# Patient Record
Sex: Male | Born: 1945 | Race: White | Hispanic: No | Marital: Married | Smoking: Former smoker
Health system: Southern US, Community
[De-identification: ages and names within clinical notes are randomized; demographics above are authoritative.]

## PROBLEM LIST (undated history)

## (undated) DIAGNOSIS — I10 Essential (primary) hypertension: Secondary | ICD-10-CM

## (undated) DIAGNOSIS — G2581 Restless legs syndrome: Secondary | ICD-10-CM

## (undated) DIAGNOSIS — K8020 Calculus of gallbladder without cholecystitis without obstruction: Secondary | ICD-10-CM

## (undated) DIAGNOSIS — I495 Sick sinus syndrome: Secondary | ICD-10-CM

## (undated) DIAGNOSIS — G4733 Obstructive sleep apnea (adult) (pediatric): Secondary | ICD-10-CM

## (undated) DIAGNOSIS — M1990 Unspecified osteoarthritis, unspecified site: Secondary | ICD-10-CM

## (undated) DIAGNOSIS — K449 Diaphragmatic hernia without obstruction or gangrene: Secondary | ICD-10-CM

## (undated) DIAGNOSIS — Q2730 Arteriovenous malformation, site unspecified: Secondary | ICD-10-CM

## (undated) DIAGNOSIS — I4891 Unspecified atrial fibrillation: Secondary | ICD-10-CM

## (undated) DIAGNOSIS — D128 Benign neoplasm of rectum: Secondary | ICD-10-CM

## (undated) DIAGNOSIS — E785 Hyperlipidemia, unspecified: Secondary | ICD-10-CM

## (undated) DIAGNOSIS — N200 Calculus of kidney: Secondary | ICD-10-CM

## (undated) DIAGNOSIS — G4730 Sleep apnea, unspecified: Secondary | ICD-10-CM

## (undated) DIAGNOSIS — I639 Cerebral infarction, unspecified: Secondary | ICD-10-CM

## (undated) DIAGNOSIS — K219 Gastro-esophageal reflux disease without esophagitis: Secondary | ICD-10-CM

## (undated) DIAGNOSIS — K2270 Barrett's esophagus without dysplasia: Secondary | ICD-10-CM

## (undated) HISTORY — DX: Arteriovenous malformation, site unspecified: Q27.30

## (undated) HISTORY — DX: Sleep apnea, unspecified: G47.30

## (undated) HISTORY — DX: Unspecified osteoarthritis, unspecified site: M19.90

## (undated) HISTORY — PX: CHOLECYSTECTOMY OPEN: SUR202

## (undated) HISTORY — DX: Gastro-esophageal reflux disease without esophagitis: K21.9

## (undated) HISTORY — DX: Obstructive sleep apnea (adult) (pediatric): G47.33

## (undated) HISTORY — DX: Hyperlipidemia, unspecified: E78.5

## (undated) HISTORY — DX: Unspecified atrial fibrillation: I48.91

## (undated) HISTORY — DX: Diaphragmatic hernia without obstruction or gangrene: K44.9

## (undated) HISTORY — DX: Essential (primary) hypertension: I10

## (undated) HISTORY — DX: Restless legs syndrome: G25.81

## (undated) HISTORY — DX: Benign neoplasm of rectum: D12.8

## (undated) HISTORY — PX: KNEE ARTHROSCOPY: SUR90

## (undated) HISTORY — DX: Sick sinus syndrome: I49.5

## (undated) HISTORY — DX: Calculus of gallbladder without cholecystitis without obstruction: K80.20

## (undated) HISTORY — DX: Barrett's esophagus without dysplasia: K22.70

## (undated) HISTORY — DX: Cerebral infarction, unspecified: I63.9

## (undated) HISTORY — DX: Calculus of kidney: N20.0

## (undated) HISTORY — PX: OTHER SURGICAL HISTORY: SHX169

## (undated) HISTORY — PX: TONSILLECTOMY: SUR1361

---

## 1993-03-10 DIAGNOSIS — K2270 Barrett's esophagus without dysplasia: Secondary | ICD-10-CM

## 1993-03-10 HISTORY — DX: Barrett's esophagus without dysplasia: K22.70

## 1998-07-10 DIAGNOSIS — D128 Benign neoplasm of rectum: Secondary | ICD-10-CM

## 1998-07-10 HISTORY — DX: Benign neoplasm of rectum: D12.8

## 1998-08-04 ENCOUNTER — Other Ambulatory Visit: Admission: RE | Admit: 1998-08-04 | Discharge: 1998-08-04 | Payer: Self-pay | Admitting: Gastroenterology

## 2000-08-31 ENCOUNTER — Encounter (INDEPENDENT_AMBULATORY_CARE_PROVIDER_SITE_OTHER): Payer: Self-pay | Admitting: Specialist

## 2000-08-31 ENCOUNTER — Other Ambulatory Visit: Admission: RE | Admit: 2000-08-31 | Discharge: 2000-08-31 | Payer: Self-pay | Admitting: Gastroenterology

## 2004-02-17 ENCOUNTER — Ambulatory Visit (HOSPITAL_COMMUNITY): Admission: RE | Admit: 2004-02-17 | Discharge: 2004-02-17 | Payer: Self-pay | Admitting: *Deleted

## 2004-08-25 ENCOUNTER — Ambulatory Visit: Payer: Self-pay | Admitting: Gastroenterology

## 2004-09-09 ENCOUNTER — Ambulatory Visit: Payer: Self-pay | Admitting: Gastroenterology

## 2005-08-27 ENCOUNTER — Emergency Department (HOSPITAL_COMMUNITY): Admission: EM | Admit: 2005-08-27 | Discharge: 2005-08-28 | Payer: Self-pay | Admitting: Family Medicine

## 2005-08-29 ENCOUNTER — Ambulatory Visit: Payer: Self-pay | Admitting: Cardiology

## 2005-08-30 ENCOUNTER — Inpatient Hospital Stay (HOSPITAL_COMMUNITY): Admission: EM | Admit: 2005-08-30 | Discharge: 2005-09-02 | Payer: Self-pay | Admitting: Emergency Medicine

## 2005-09-01 ENCOUNTER — Ambulatory Visit: Payer: Self-pay | Admitting: Internal Medicine

## 2005-09-02 ENCOUNTER — Ambulatory Visit: Payer: Self-pay | Admitting: Gastroenterology

## 2005-09-13 ENCOUNTER — Ambulatory Visit (HOSPITAL_COMMUNITY): Admission: RE | Admit: 2005-09-13 | Discharge: 2005-09-13 | Payer: Self-pay | Admitting: Gastroenterology

## 2005-09-20 ENCOUNTER — Ambulatory Visit: Payer: Self-pay | Admitting: Cardiovascular Disease

## 2005-09-28 ENCOUNTER — Ambulatory Visit: Payer: Self-pay

## 2005-09-28 ENCOUNTER — Encounter: Payer: Self-pay | Admitting: Cardiology

## 2005-10-16 ENCOUNTER — Ambulatory Visit: Payer: Self-pay | Admitting: Cardiology

## 2005-12-19 ENCOUNTER — Ambulatory Visit: Payer: Self-pay | Admitting: Cardiology

## 2005-12-21 ENCOUNTER — Emergency Department (HOSPITAL_COMMUNITY): Admission: EM | Admit: 2005-12-21 | Discharge: 2005-12-21 | Payer: Self-pay | Admitting: Emergency Medicine

## 2006-02-26 ENCOUNTER — Encounter (HOSPITAL_BASED_OUTPATIENT_CLINIC_OR_DEPARTMENT_OTHER): Admission: RE | Admit: 2006-02-26 | Discharge: 2006-05-14 | Payer: Self-pay | Admitting: Surgery

## 2006-05-24 ENCOUNTER — Ambulatory Visit: Payer: Self-pay | Admitting: Cardiology

## 2006-06-05 ENCOUNTER — Ambulatory Visit: Payer: Self-pay | Admitting: Gastroenterology

## 2006-07-10 DIAGNOSIS — I639 Cerebral infarction, unspecified: Secondary | ICD-10-CM

## 2006-07-10 HISTORY — DX: Cerebral infarction, unspecified: I63.9

## 2006-11-13 ENCOUNTER — Ambulatory Visit: Payer: Self-pay | Admitting: Cardiology

## 2006-12-31 ENCOUNTER — Inpatient Hospital Stay (HOSPITAL_COMMUNITY): Admission: EM | Admit: 2006-12-31 | Discharge: 2007-01-05 | Payer: Self-pay | Admitting: Emergency Medicine

## 2006-12-31 ENCOUNTER — Ambulatory Visit: Payer: Self-pay | Admitting: Cardiovascular Disease

## 2007-01-01 ENCOUNTER — Encounter (INDEPENDENT_AMBULATORY_CARE_PROVIDER_SITE_OTHER): Payer: Self-pay | Admitting: Neurology

## 2007-01-02 ENCOUNTER — Encounter: Payer: Self-pay | Admitting: Internal Medicine

## 2007-01-08 ENCOUNTER — Ambulatory Visit: Payer: Self-pay | Admitting: Cardiology

## 2007-01-14 ENCOUNTER — Ambulatory Visit: Payer: Self-pay | Admitting: Internal Medicine

## 2007-01-14 ENCOUNTER — Ambulatory Visit: Payer: Self-pay | Admitting: Cardiovascular Disease

## 2007-01-21 ENCOUNTER — Ambulatory Visit: Payer: Self-pay | Admitting: Internal Medicine

## 2007-01-28 ENCOUNTER — Ambulatory Visit: Payer: Self-pay | Admitting: Cardiovascular Disease

## 2007-02-04 ENCOUNTER — Ambulatory Visit: Payer: Self-pay | Admitting: Internal Medicine

## 2007-02-07 ENCOUNTER — Ambulatory Visit: Payer: Self-pay | Admitting: Cardiology

## 2007-02-07 ENCOUNTER — Ambulatory Visit (HOSPITAL_BASED_OUTPATIENT_CLINIC_OR_DEPARTMENT_OTHER): Admission: RE | Admit: 2007-02-07 | Discharge: 2007-02-07 | Payer: Self-pay | Admitting: Internal Medicine

## 2007-02-10 ENCOUNTER — Ambulatory Visit: Payer: Self-pay | Admitting: Internal Medicine

## 2007-02-18 ENCOUNTER — Ambulatory Visit: Payer: Self-pay | Admitting: Internal Medicine

## 2007-02-19 ENCOUNTER — Ambulatory Visit: Payer: Self-pay

## 2007-02-21 ENCOUNTER — Ambulatory Visit: Payer: Self-pay | Admitting: Internal Medicine

## 2007-03-13 ENCOUNTER — Ambulatory Visit: Payer: Self-pay | Admitting: Cardiovascular Disease

## 2007-03-27 ENCOUNTER — Ambulatory Visit: Payer: Self-pay | Admitting: Internal Medicine

## 2007-04-10 ENCOUNTER — Ambulatory Visit: Payer: Self-pay | Admitting: Cardiology

## 2007-04-19 ENCOUNTER — Ambulatory Visit: Payer: Self-pay | Admitting: Cardiology

## 2007-05-10 ENCOUNTER — Ambulatory Visit: Payer: Self-pay | Admitting: Cardiology

## 2007-06-10 ENCOUNTER — Ambulatory Visit: Payer: Self-pay | Admitting: Cardiology

## 2007-07-01 ENCOUNTER — Ambulatory Visit: Payer: Self-pay | Admitting: Cardiology

## 2007-07-01 LAB — CONVERTED CEMR LAB
Albumin: 4.1 g/dL (ref 3.5–5.2)
Alkaline Phosphatase: 58 units/L (ref 39–117)
Bilirubin, Direct: 0.1 mg/dL (ref 0.0–0.3)
Direct LDL: 150.6 mg/dL
Total Bilirubin: 1 mg/dL (ref 0.3–1.2)
Total CHOL/HDL Ratio: 6.3
Total Protein: 7.3 g/dL (ref 6.0–8.3)

## 2007-07-24 DIAGNOSIS — I4891 Unspecified atrial fibrillation: Secondary | ICD-10-CM | POA: Insufficient documentation

## 2007-07-24 DIAGNOSIS — K219 Gastro-esophageal reflux disease without esophagitis: Secondary | ICD-10-CM

## 2007-07-24 DIAGNOSIS — E785 Hyperlipidemia, unspecified: Secondary | ICD-10-CM

## 2007-07-24 DIAGNOSIS — G4733 Obstructive sleep apnea (adult) (pediatric): Secondary | ICD-10-CM

## 2007-07-24 DIAGNOSIS — G2581 Restless legs syndrome: Secondary | ICD-10-CM

## 2007-07-24 DIAGNOSIS — I6350 Cerebral infarction due to unspecified occlusion or stenosis of unspecified cerebral artery: Secondary | ICD-10-CM | POA: Insufficient documentation

## 2007-07-25 ENCOUNTER — Ambulatory Visit: Payer: Self-pay | Admitting: Internal Medicine

## 2007-07-25 DIAGNOSIS — G4730 Sleep apnea, unspecified: Secondary | ICD-10-CM | POA: Insufficient documentation

## 2007-07-25 DIAGNOSIS — I6789 Other cerebrovascular disease: Secondary | ICD-10-CM

## 2007-07-25 DIAGNOSIS — J3089 Other allergic rhinitis: Secondary | ICD-10-CM

## 2007-07-25 DIAGNOSIS — J302 Other seasonal allergic rhinitis: Secondary | ICD-10-CM

## 2007-07-29 ENCOUNTER — Ambulatory Visit: Payer: Self-pay | Admitting: Cardiovascular Disease

## 2007-08-10 LAB — CONVERTED CEMR LAB: TSH: 2.42 microintl units/mL (ref 0.35–5.50)

## 2007-08-19 ENCOUNTER — Ambulatory Visit: Payer: Self-pay | Admitting: Cardiology

## 2007-08-28 ENCOUNTER — Ambulatory Visit: Payer: Self-pay | Admitting: Gastroenterology

## 2007-08-30 ENCOUNTER — Ambulatory Visit: Payer: Self-pay | Admitting: Gastroenterology

## 2007-09-11 ENCOUNTER — Encounter: Payer: Self-pay | Admitting: Gastroenterology

## 2007-09-11 ENCOUNTER — Ambulatory Visit: Payer: Self-pay | Admitting: Gastroenterology

## 2007-09-19 ENCOUNTER — Ambulatory Visit: Payer: Self-pay | Admitting: Cardiology

## 2007-10-17 ENCOUNTER — Ambulatory Visit: Payer: Self-pay | Admitting: Cardiovascular Disease

## 2007-10-29 ENCOUNTER — Ambulatory Visit: Payer: Self-pay | Admitting: Cardiology

## 2007-10-31 ENCOUNTER — Ambulatory Visit: Payer: Self-pay | Admitting: Cardiology

## 2007-10-31 LAB — CONVERTED CEMR LAB
AST: 32 units/L (ref 0–37)
Bilirubin, Direct: 0.1 mg/dL (ref 0.0–0.3)
Cholesterol: 283 mg/dL (ref 0–200)
Direct LDL: 168.1 mg/dL
HDL: 39.1 mg/dL (ref >39.0)
Triglycerides: 380 mg/dL (ref 0–149)
VLDL: 76 mg/dL — ABNORMAL HIGH (ref 0–40)

## 2007-11-06 ENCOUNTER — Ambulatory Visit: Payer: Self-pay | Admitting: Internal Medicine

## 2007-11-14 ENCOUNTER — Ambulatory Visit: Payer: Self-pay | Admitting: Internal Medicine

## 2007-11-14 LAB — CONVERTED CEMR LAB
BUN: 19 mg/dL (ref 6–23)
Basophils Absolute: 0 10*3/uL (ref 0.0–0.1)
CO2: 26 meq/L (ref 19–32)
Creatinine, Ser: 1.3 mg/dL (ref 0.4–1.5)
Eosinophils Absolute: 0.1 10*3/uL (ref 0.0–0.7)
Eosinophils Relative: 2.2 % (ref 0.0–5.0)
GFR calc Af Amer: 72 mL/min
GFR calc non Af Amer: 60 mL/min
Glucose, Bld: 69 mg/dL — ABNORMAL LOW (ref 70–99)
HCT: 43.1 % (ref 39.0–52.0)
Hemoglobin: 14.8 g/dL (ref 13.0–17.0)
MCV: 93.1 fL (ref 78.0–100.0)
Monocytes Absolute: 0.5 10*3/uL (ref 0.1–1.0)
Monocytes Relative: 8.2 % (ref 3.0–12.0)
Neutrophils Relative %: 73.8 % (ref 43.0–77.0)
RDW: 12.9 % (ref 11.5–14.6)
Sodium: 140 meq/L (ref 135–145)

## 2007-11-15 ENCOUNTER — Encounter: Payer: Self-pay | Admitting: Internal Medicine

## 2007-11-15 ENCOUNTER — Ambulatory Visit: Payer: Self-pay | Admitting: Internal Medicine

## 2007-11-15 ENCOUNTER — Ambulatory Visit (HOSPITAL_COMMUNITY): Admission: RE | Admit: 2007-11-15 | Discharge: 2007-11-16 | Payer: Self-pay | Admitting: Internal Medicine

## 2007-11-15 HISTORY — PX: PACEMAKER INSERTION: SHX728

## 2007-11-20 ENCOUNTER — Ambulatory Visit: Payer: Self-pay | Admitting: Internal Medicine

## 2007-12-10 ENCOUNTER — Encounter: Payer: Self-pay | Admitting: Gastroenterology

## 2007-12-11 ENCOUNTER — Ambulatory Visit: Payer: Self-pay

## 2007-12-11 ENCOUNTER — Ambulatory Visit: Payer: Self-pay | Admitting: Cardiovascular Disease

## 2008-01-23 ENCOUNTER — Ambulatory Visit: Payer: Self-pay | Admitting: Cardiology

## 2008-02-14 ENCOUNTER — Ambulatory Visit: Payer: Self-pay | Admitting: Cardiology

## 2008-03-06 ENCOUNTER — Ambulatory Visit: Payer: Self-pay | Admitting: Cardiology

## 2008-03-17 ENCOUNTER — Ambulatory Visit: Payer: Self-pay | Admitting: Internal Medicine

## 2008-04-03 ENCOUNTER — Ambulatory Visit: Payer: Self-pay | Admitting: Internal Medicine

## 2008-04-17 ENCOUNTER — Ambulatory Visit: Payer: Self-pay | Admitting: Cardiology

## 2008-05-08 ENCOUNTER — Ambulatory Visit: Payer: Self-pay | Admitting: Internal Medicine

## 2008-06-03 ENCOUNTER — Ambulatory Visit: Payer: Self-pay | Admitting: Cardiology

## 2008-06-19 ENCOUNTER — Ambulatory Visit: Payer: Self-pay | Admitting: Cardiovascular Disease

## 2008-07-01 ENCOUNTER — Ambulatory Visit: Payer: Self-pay | Admitting: Cardiology

## 2008-07-01 LAB — CONVERTED CEMR LAB
Albumin: 3.7 g/dL (ref 3.5–5.2)
Alkaline Phosphatase: 77 units/L (ref 39–117)
Bilirubin, Direct: 0.1 mg/dL (ref 0.0–0.3)
Cholesterol: 283 mg/dL (ref 0–200)
HDL: 42.5 mg/dL (ref >39.0)
Total CHOL/HDL Ratio: 6.7
Triglycerides: 680 mg/dL (ref 0–149)

## 2008-07-17 ENCOUNTER — Ambulatory Visit: Payer: Self-pay | Admitting: Cardiology

## 2008-07-24 ENCOUNTER — Ambulatory Visit: Payer: Self-pay | Admitting: Internal Medicine

## 2008-08-14 ENCOUNTER — Ambulatory Visit: Payer: Self-pay | Admitting: Cardiovascular Disease

## 2008-09-11 ENCOUNTER — Ambulatory Visit: Payer: Self-pay | Admitting: Internal Medicine

## 2008-10-06 ENCOUNTER — Ambulatory Visit: Payer: Self-pay | Admitting: Cardiology

## 2008-10-16 ENCOUNTER — Encounter: Payer: Self-pay | Admitting: Internal Medicine

## 2008-11-04 ENCOUNTER — Ambulatory Visit: Payer: Self-pay | Admitting: Cardiology

## 2008-11-06 ENCOUNTER — Encounter: Payer: Self-pay | Admitting: Internal Medicine

## 2008-12-02 ENCOUNTER — Ambulatory Visit: Payer: Self-pay | Admitting: Internal Medicine

## 2008-12-08 ENCOUNTER — Encounter: Payer: Self-pay | Admitting: *Deleted

## 2008-12-15 ENCOUNTER — Ambulatory Visit: Payer: Self-pay | Admitting: Internal Medicine

## 2009-01-05 ENCOUNTER — Encounter (INDEPENDENT_AMBULATORY_CARE_PROVIDER_SITE_OTHER): Payer: Self-pay | Admitting: Pharmacist

## 2009-01-05 ENCOUNTER — Ambulatory Visit: Payer: Self-pay | Admitting: Internal Medicine

## 2009-01-05 LAB — CONVERTED CEMR LAB
POC INR: 2.3
Prothrombin Time: 18.4 s

## 2009-01-13 ENCOUNTER — Encounter: Payer: Self-pay | Admitting: *Deleted

## 2009-02-11 ENCOUNTER — Ambulatory Visit: Payer: Self-pay | Admitting: Cardiology

## 2009-02-11 LAB — CONVERTED CEMR LAB
POC INR: 1.7
Prothrombin Time: 16.2 s

## 2009-03-04 ENCOUNTER — Ambulatory Visit: Payer: Self-pay | Admitting: Cardiovascular Disease

## 2009-04-05 ENCOUNTER — Ambulatory Visit: Payer: Self-pay | Admitting: Cardiology

## 2009-05-06 ENCOUNTER — Ambulatory Visit: Payer: Self-pay | Admitting: Cardiology

## 2009-05-20 ENCOUNTER — Encounter: Payer: Self-pay | Admitting: Internal Medicine

## 2009-06-02 ENCOUNTER — Ambulatory Visit: Payer: Self-pay | Admitting: Cardiology

## 2009-06-16 ENCOUNTER — Encounter: Payer: Self-pay | Admitting: Internal Medicine

## 2009-06-16 ENCOUNTER — Telehealth (INDEPENDENT_AMBULATORY_CARE_PROVIDER_SITE_OTHER): Payer: Self-pay | Admitting: Cardiology

## 2009-06-16 ENCOUNTER — Ambulatory Visit: Payer: Self-pay

## 2009-06-18 ENCOUNTER — Telehealth: Payer: Self-pay | Admitting: Internal Medicine

## 2009-06-18 ENCOUNTER — Telehealth: Payer: Self-pay | Admitting: Cardiology

## 2009-06-30 ENCOUNTER — Ambulatory Visit: Payer: Self-pay | Admitting: Internal Medicine

## 2009-06-30 ENCOUNTER — Encounter: Payer: Self-pay | Admitting: Internal Medicine

## 2009-06-30 LAB — CONVERTED CEMR LAB: POC INR: 2.8

## 2009-07-23 ENCOUNTER — Ambulatory Visit: Payer: Self-pay | Admitting: Internal Medicine

## 2009-07-23 DIAGNOSIS — J4520 Mild intermittent asthma, uncomplicated: Secondary | ICD-10-CM | POA: Insufficient documentation

## 2009-07-30 ENCOUNTER — Ambulatory Visit: Payer: Self-pay | Admitting: Cardiovascular Disease

## 2009-07-30 LAB — CONVERTED CEMR LAB: POC INR: 3.2

## 2009-08-02 LAB — CONVERTED CEMR LAB
ALT: 29 units/L (ref 0–53)
Alkaline Phosphatase: 50 units/L (ref 39–117)
Creatinine, Ser: 1.4 mg/dL (ref 0.4–1.5)
GFR calc non Af Amer: 54.4 mL/min (ref >60)
Potassium: 4.1 meq/L (ref 3.5–5.1)
Total Bilirubin: 0.8 mg/dL (ref 0.3–1.2)

## 2009-08-20 ENCOUNTER — Ambulatory Visit: Payer: Self-pay | Admitting: Internal Medicine

## 2009-09-06 ENCOUNTER — Ambulatory Visit: Payer: Self-pay | Admitting: Cardiology

## 2009-09-06 LAB — CONVERTED CEMR LAB: POC INR: 2.6

## 2009-10-07 ENCOUNTER — Ambulatory Visit: Payer: Self-pay | Admitting: Internal Medicine

## 2009-10-07 LAB — CONVERTED CEMR LAB: POC INR: 2.7

## 2009-11-04 ENCOUNTER — Ambulatory Visit: Payer: Self-pay | Admitting: Cardiology

## 2009-11-04 LAB — CONVERTED CEMR LAB: POC INR: 3.2

## 2009-11-19 ENCOUNTER — Encounter: Payer: Self-pay | Admitting: Internal Medicine

## 2009-12-02 ENCOUNTER — Ambulatory Visit: Payer: Self-pay | Admitting: Cardiology

## 2009-12-02 LAB — CONVERTED CEMR LAB: POC INR: 2.8

## 2010-01-03 ENCOUNTER — Ambulatory Visit: Payer: Self-pay | Admitting: Internal Medicine

## 2010-01-03 ENCOUNTER — Ambulatory Visit: Payer: Self-pay | Admitting: Cardiology

## 2010-01-03 DIAGNOSIS — R0989 Other specified symptoms and signs involving the circulatory and respiratory systems: Secondary | ICD-10-CM

## 2010-01-03 DIAGNOSIS — R0609 Other forms of dyspnea: Secondary | ICD-10-CM

## 2010-01-12 ENCOUNTER — Telehealth: Payer: Self-pay | Admitting: Internal Medicine

## 2010-02-01 ENCOUNTER — Ambulatory Visit: Payer: Self-pay | Admitting: Cardiology

## 2010-03-02 ENCOUNTER — Ambulatory Visit: Payer: Self-pay | Admitting: Internal Medicine

## 2010-03-02 LAB — CONVERTED CEMR LAB: POC INR: 2.5

## 2010-03-29 ENCOUNTER — Encounter: Payer: Self-pay | Admitting: Internal Medicine

## 2010-03-29 ENCOUNTER — Ambulatory Visit: Payer: Self-pay | Admitting: Internal Medicine

## 2010-03-29 ENCOUNTER — Ambulatory Visit: Payer: Self-pay | Admitting: Cardiology

## 2010-03-29 DIAGNOSIS — R55 Syncope and collapse: Secondary | ICD-10-CM

## 2010-03-29 LAB — CONVERTED CEMR LAB: POC INR: 3

## 2010-04-04 LAB — CONVERTED CEMR LAB
Eosinophils Absolute: 0.1 10*3/uL (ref 0.0–0.7)
HCT: 41.2 % (ref 39.0–52.0)
Hemoglobin: 14.2 g/dL (ref 13.0–17.0)
MCHC: 34.5 g/dL (ref 30.0–36.0)
Monocytes Absolute: 0.6 10*3/uL (ref 0.1–1.0)
Monocytes Relative: 10.4 % (ref 3.0–12.0)
Neutro Abs: 3.8 10*3/uL (ref 1.4–7.7)
Platelets: 147 10*3/uL — ABNORMAL LOW (ref 150.0–400.0)
RBC: 4.41 M/uL (ref 4.22–5.81)
WBC: 5.5 10*3/uL (ref 4.5–10.5)

## 2010-04-25 ENCOUNTER — Telehealth: Payer: Self-pay | Admitting: Internal Medicine

## 2010-04-27 ENCOUNTER — Ambulatory Visit: Payer: Self-pay | Admitting: Internal Medicine

## 2010-04-28 LAB — CONVERTED CEMR LAB
Basophils Absolute: 0.1 10*3/uL (ref 0.0–0.1)
Eosinophils Absolute: 0.1 10*3/uL (ref 0.0–0.7)
HCT: 45.1 % (ref 39.0–52.0)
Hemoglobin: 15.6 g/dL (ref 13.0–17.0)
MCHC: 34.5 g/dL (ref 30.0–36.0)
MCV: 95 fL (ref 78.0–100.0)
Monocytes Absolute: 0.6 10*3/uL (ref 0.1–1.0)
RBC: 4.75 M/uL (ref 4.22–5.81)
RDW: 14.3 % (ref 11.5–14.6)

## 2010-06-01 ENCOUNTER — Encounter: Payer: Self-pay | Admitting: Internal Medicine

## 2010-06-01 ENCOUNTER — Ambulatory Visit: Payer: Self-pay | Admitting: Internal Medicine

## 2010-06-30 ENCOUNTER — Ambulatory Visit: Payer: Self-pay | Admitting: Internal Medicine

## 2010-06-30 DIAGNOSIS — E236 Other disorders of pituitary gland: Secondary | ICD-10-CM | POA: Insufficient documentation

## 2010-06-30 LAB — CONVERTED CEMR LAB: Testosterone: 408.58 ng/dL (ref 350.00–890.00)

## 2010-08-09 NOTE — Assessment & Plan Note (Signed)
Summary: Patient Instructions    Patient Instructions: 1)  Your physician has recommended you make the following change in your medication: Discontinue Metoprolol Succinate. Start Metoprolol Tartrate 25mg . One pill twice a day.     Appended Document: ORDERS

## 2010-08-09 NOTE — Cardiovascular Report (Signed)
Summary: Office Visit   Office Visit   Imported By: Roderic Ovens 06/13/2010 16:16:03  _____________________________________________________________________  External Attachment:    Type:   Image     Comment:   External Document

## 2010-08-09 NOTE — Progress Notes (Signed)
Summary: question re pradaxa/**LM/nm  Phone Note Call from Patient   Caller: Patient [phone] Reason for Call: Talk to Nurse Summary of Call: pt was given samples of pradaxa for 1 month, last pill tomorrow, pt calling to see if he needs a blood test, a refill or what Initial call taken by: Glynda Jaeger,  April 25, 2010 2:45 PM  Follow-up for Phone Call        lmfcb. need pharmacy info and will set up lab appt. Follow-up by: Claris Gladden RN,  April 25, 2010 3:26 PM  Additional Follow-up for Phone Call Additional follow up Details #1::        per pt calling back [phone] phone Lorne Skeens  April 26, 2010 12:51 PM   LMTCB. Ollen Gross, RN, BSN  April 26, 2010 2:41 PM  Pt. called back Pt.states was given a month supply for Pradaxa 150 mg . He only has one pill left for tommorrow. Pt. states has no problem taken this medication. [facility] Also pt. would like to have an appointment for blood work. I let pt. know I will send the prescription for Pradaxa 150 mg twice a day. I will let Bjorn Loser know regarding needing to scheduled  the Blood work. Pt. verbalized understanding.  Additional Follow-up by: Ollen Gross, RN, BSN,  April 26, 2010 2:54 PM    Additional Follow-up for Phone Call Additional follow up Details #2::    ok Follow-up by: Nathen May, MD, [facility],  April 26, 2010 6:11 PM  New/Updated Medications: PRADAXA 150 MG CAPS (DABIGATRAN ETEXILATE MESYLATE) take one tablet by mouth twice a day. Prescriptions: PRADAXA 150 MG CAPS (DABIGATRAN ETEXILATE MESYLATE) take one tablet by mouth twice a day.  #60 x 6   Entered by:   Ollen Gross, RN, BSN   Authorized by:   Nathen May, MD, [facility]   Signed by:   Ollen Gross, RN, BSN on 04/26/2010   Method used:   [facility] [phone]* (retail)       [address]       [place], [place]   [address]       Ph: [phone] or [phone]       Fax: [phone]   RxID:   [id number]

## 2010-08-09 NOTE — Medication Information (Signed)
Summary: rov/jk  Anticoagulant Therapy  Managed by: Lyna Poser, PharmD Referring MD: Nona Dell PCP: [facility] Supervising MD: Daleen Squibb MD,Anniebell Bedore Indication 1: CVA-stroke (ICD-436) Lab Used: [facility] Site: [address] INR POC 3 INR RANGE 2 - 3  Dietary changes: no    Health status changes: no    Bleeding/hemorrhagic complications: no    Recent/future hospitalizations: no    Any changes in medication regimen? no    Recent/future dental: no  Any missed doses?: no       Is patient compliant with meds? yes      Comments: Has increased alcohol intake. Been doing some wine tasting.   Allergies: 1)  ! * Niaspan  Anticoagulation Management History:      The patient is taking warfarin and comes in today for a routine follow up visit.  Positive risk factors for bleeding include history of CVA/TIA.  Negative risk factors for bleeding include an age less than 64 years old.  The bleeding index is 'intermediate risk'.  Positive CHADS2 values include Prior Stroke/CVA/TIA.  Negative CHADS2 values include Age > 85 years old.  The start date was 01/04/2007.  His last INR was 3.0 RATIO.  Anticoagulation responsible provider: Debby Clyne MD,Akayla Brass.  INR POC: 3.  Cuvette Lot#: [id number].  Exp: 04/2011.    Anticoagulation Management Assessment/Plan:      The patient's current anticoagulation dose is Coumadin 5 mg tabs: per coumadin clinic.  The target INR is 2 - 3.  The next INR is due 04/27/2010.  Anticoagulation instructions were given to patient.  Results were reviewed/authorized by Lyna Poser, PharmD.         Prior Anticoagulation Instructions: INR 2.5  Continue taking 1 tablet (5mg ) every day except take 1/2 tablet (2.5mg ) on Mondays and Thursdays.  Recheck in 4 weeks.    Current Anticoagulation Instructions: INR 3 Continue 1 tablet everyday except on monday and thursday. On monday and thursday take a half of a tablet. Recheck in 4 weeks.

## 2010-08-09 NOTE — Medication Information (Signed)
Summary: Coumadin Clinic  Anticoagulant Therapy  Managed by: Shelby Dubin, PharmD, BCPS, CPP Referring MD: Nona Dell PCP: [facility] Ernesto Rutherford Supervising MD: Eden Emms MD, Theron Arista Indication 1: CVA-stroke (ICD-436) Lab Used: [facility] Site: [facility] INR RANGE 2 - 3           Allergies: 1)  ! * Niaspan  Anticoagulation Management History:      Positive risk factors for bleeding include history of CVA/TIA.  Negative risk factors for bleeding include an age less than 66 years old.  The bleeding index is 'intermediate risk'.  Positive CHADS2 values include Prior Stroke/CVA/TIA.  Negative CHADS2 values include Age > 35 years old.  The start date was 01/04/2007.  His last INR was 3.0 RATIO.  Anticoagulation responsible provider: Eden Emms MD, Theron Arista.  Exp: 08/2010.    Anticoagulation Management Assessment/Plan:      The patient's current anticoagulation dose is Coumadin 5 mg tabs: per coumadin clinic.  The target INR is 2 - 3.  The next INR is due 08/23/2009.  Anticoagulation instructions were given to patient.  Results were reviewed/authorized by Shelby Dubin, PharmD, BCPS, CPP.         Prior Anticoagulation Instructions: INR: 3.2 Take 1/2 tablet today, then resume to 5mg  tablet daily except 2.5mg  on Mondays and Thursdays Recheck in 3 weeks

## 2010-08-09 NOTE — Medication Information (Signed)
Summary: rov/ewj  Anticoagulant Therapy  Managed by: Shelby Dubin, PharmD, BCPS, CPP Referring MD: Nona Dell PCP: [facility] Ernesto Rutherford Supervising MD: Eden Emms MD, Theron Arista Indication 1: CVA-stroke (ICD-436) Lab Used: [facility] Site: [facility] INR POC 3.2 INR RANGE 2 - 3  Dietary changes: yes       Details: less vitamin K intake  Health status changes: no    Bleeding/hemorrhagic complications: no    Recent/future hospitalizations: no    Any changes in medication regimen? no    Recent/future dental: no  Any missed doses?: no       Is patient compliant with meds? yes       Allergies (verified): 1)  ! * Niaspan  Anticoagulation Management History:      The patient is taking warfarin and comes in today for a routine follow up visit.  Positive risk factors for bleeding include history of CVA/TIA.  Negative risk factors for bleeding include an age less than 12 years old.  The bleeding index is 'intermediate risk'.  Positive CHADS2 values include Prior Stroke/CVA/TIA.  Negative CHADS2 values include Age > 64 years old.  The start date was 01/04/2007.  His last INR was 3.0 RATIO.  Anticoagulation responsible provider: Eden Emms MD, Theron Arista.  INR POC: 3.2.  Cuvette Lot#: [id number].  Exp: 08/2010.    Anticoagulation Management Assessment/Plan:      The patient's current anticoagulation dose is Coumadin 5 mg tabs: per coumadin clinic.  The target INR is 2 - 3.  The next INR is due 08/23/2009.  Anticoagulation instructions were given to patient.  Results were reviewed/authorized by Shelby Dubin, PharmD, BCPS, CPP.  He was notified by Ysidro Evert, Pharm D Candidate.         Prior Anticoagulation Instructions: INR 2.8  Continue on same dosage 1 tablet daily except 1/2 tablet on Mondays and Thursdays.   Recheck in 4 weeks.    Current Anticoagulation Instructions: INR: 3.2 Take 1/2 tablet today, then resume to 5mg  tablet daily except 2.5mg  on Mondays and Thursdays Recheck in 3 weeks

## 2010-08-09 NOTE — Miscellaneous (Signed)
Summary: Orders Update pft charges  Clinical Lists Changes  Orders: Added new Service order of Carbon Monoxide diffusing w/capacity (94720) - Signed Added new Service order of Lung Volumes (94240) - Signed Added new Service order of Spirometry (Pre & Post) (94060) - Signed 

## 2010-08-09 NOTE — Assessment & Plan Note (Signed)
Summary: Z3Y   Primary Provider:  Zenovia Jarred  CC:  f1y/pt has questions on appropriate dose of vitamin d.  History of Present Illness: [name] is seen in followup for atrial fibrillation for which he takes flecainide. He had posttermination pauses and has undergone pacemaker implantation. He has had no recurrent syncope.  He has noted some issues with irregularity which he treated to recurrence of atrial fibrillation; he is also noted some issues with exercise intolerance manifested primarily when climbing stairs. He says he is able to run and lift weights without shortness of breath with climbing stairs can be an issue.  he went and saw Dr. Maple Hudson who undertook PFTs; those reports were normal   He continues to scuba dive avidly   Current Medications (verified): 1)  Tambocor 100 Mg  Tabs (Flecainide Acetate) .... Take 1 Tablet By Mouth Two Times A Day 2)  Metoprolol Succinate 25 Mg  Tb24 (Metoprolol Succinate) .... Take One Tablet Once Daily 3)  Requip 2 Mg  Tabs (Ropinirole Hcl) .... Take 1 Tab By Mouth At Bedtime 4)  Matzim La 180 Mg Xr24h-Tab (Diltiazem Hcl Coated Beads) .... Take One Tablet Two Times A Day 5)  Zetia 10 Mg  Tabs (Ezetimibe) .... Take 1 Tablet By Mouth Once A Day 6)  Prilosec 40 Mg Cpdr (Omeprazole) .... Once Daily 7)  Fish Oil 1000 Mg Caps (Omega-3 Fatty Acids) .... Take 1 Capsule By Mouth Two Times A Day 8)  Coumadin 5 Mg Tabs (Warfarin Sodium) .... Per Coumadin Clinic 9)  Multivitamins   Tabs (Multiple Vitamin) .... Take 1 Tablet By Mouth Once A Day 10)  Glucosamine-Chondroitin   Caps (Glucosamine-Chondroit-Vit C-Mn) .... Take 1 Capsule By Mouth Two Times A Day 11)  Nasonex 50 Mcg/act Susp (Mometasone Furoate) .Marland Kitchen.. 1-2 Puffs Each Nostril Daily 12)  Cozaar 50 Mg Tabs (Losartan Potassium) .... Take One Tablet By Mouth Daily 13)  Fenofibrate 160 Mg Tabs (Fenofibrate) .... Take One Tablet By Mouth Daily With A Meal 14)  Cpap 7 Advanced 15)  Vitamin D3 1000 Unit Tabs  (Cholecalciferol) .... Take One Tablet Once Daily  Allergies (verified): 1)  ! 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 HEENT Normal.  Neck veins were flat, carotids were brisk.  Lungs were clear.  Heart sounds were regular without murmurs or gallops.  Abdomen was soft with active bowel sounds. There is no clubbing cyanosis or edema. Skin Warm and dry    PPM Specifications Following MD:  Sherryl Manges, MD  Referring MD:  MCDOWELL PPM Vendor:  St Jude     PPM Model Number:  [id number]     PPM Serial Number:  [id number] PPM DOI:  11/15/2007     PPM Implanting MD:  Sherryl Manges, MD  Lead 1    Location: [facility]     DOI: 11/15/2007     Model #: [id number]     Serial #: [id number]     Status: active Lead 2    Location: [facility]     DOI: 11/15/2007     Model #: [id number]     Serial #: [id number]     Status: active   Indications:  PAF WITH PAUSES   PPM Follow Up Pacer Dependent:  No      Episodes Coumadin:  Yes  Parameters Mode:  DDDR     Lower Rate Limit:  60     Upper Rate Limit:   125 Paced AV Delay:  200     Sensed AV Delay:  200  Impression & Recommendations:  Problem # 1:  FIBRILLATION, ATRIAL (ICD-427.31) the patient remains on Tambocor for his atrial fibrillation. This has resulted in him being in flutter which has been asymptomatic. However, there has been some tracking which we have reprogrammed around by going to [facility] His updated medication list for this problem includes:    Tambocor 100 Mg Tabs (Flecainide acetate) .Marland Kitchen... Take 1 tablet by mouth two times a day    Metoprolol Succinate 25 Mg Tb24 (Metoprolol succinate) .Marland Kitchen... Take one tablet once daily    Coumadin 5 Mg Tabs (Warfarin sodium) .Marland Kitchen... Per coumadin clinic  Orders: EKG w/ Interpretation (93000)  Problem # 2:  PACEMAKER, PERMANENT  [employer] JUDES (ICD-V45.01) Device parameters and data were reviewed and changes were made as noted. There was an issue today on diagnostic interrogation and these data are not available. After review with tech services he should be available at the next interrogation   Orders: EKG w/ Interpretation (93000)  Problem # 3:  DYSPNEA ON EXERTION (ICD-786.09) this might be related to tracking of his atrial flutter at 2:1. There may be some issues related to atrial contractility and possibly impact of left ventricular systolic function. In the event that his symptoms don't abate with the aforementioned changes we will plan to undertake an echo at his next visit His updated medication list for this problem includes:    Metoprolol Succinate 25 Mg Tb24 (Metoprolol succinate) .Marland Kitchen... Take one tablet once daily    Matzim La 180 Mg Xr24h-tab (Diltiazem hcl coated beads) .Marland Kitchen... Take one tablet two times a day    Cozaar 50 Mg Tabs (Losartan potassium) .Marland Kitchen... Take one tablet by mouth daily  Patient Instructions: 1)  Your physician recommends that you schedule a follow-up appointment in: 3 months with Dr Graciela Husbands. Prescriptions: PRILOSEC 40 MG CPDR (OMEPRAZOLE) once daily  #30 x 6   Entered by:    Judithe Modest CMA   Authorized by:   Nathen May, MD, [facility]   Signed by:   Judithe Modest CMA on 01/03/2010   Method used:   [facility]  [address] [phone][facility] ([facility]ail[facility]       [address]       [place], [place]  [address]       Ph: [phone] or [phone]       Fax: [phone]   RxID:   [id number] MATZIM LA  180 MG XR24H-TAB (DILTIAZEM HCL COATED BEADS) take one tablet two times a day  #30 x 6   Entered by:   Judithe Modest CMA   Authorized by:   Nathen May, MD, [facility]   Signed by:   Judithe Modest CMA on 01/03/2010   Method used:   [facility]        [facility]  [address] [phone]* (retail)       [address]       [place], [place]  [address]       Ph: [phone] or [phone]       Fax: [phone]   RxID:   [id number] METOPROLOL SUCCINATE 25 MG  TB24 (METOPROLOL SUCCINATE) take one tablet once daily  #30 x 6   Entered by:   Judithe Modest CMA   Authorized by:   Nathen May, MD, [facility]   Signed by:   Judithe Modest CMA on 01/03/2010   Method used:   [facility]        [facility]  [address] [phone]* (retail)       [address]       [place], [place]  [address]       Ph: [phone] or [phone]       Fax: [phone]   RxID:   [id number] TAMBOCOR 100 MG  TABS (FLECAINIDE ACETATE) Take 1 tablet by mouth two times a day  #60 x 6   Entered by:   Judithe Modest CMA   Authorized by:   Nathen May, MD, [facility]   Signed by:   Judithe Modest CMA on 01/03/2010   Method used:   [facility]        [facility]  [address] [phone]* (retail)       [address]       [place], [place]  [address]       Ph: [phone] or [phone]       Fax: [phone]   RxID:   [id number]    Appended Document: [facility]     Allergies: 1)  ! * Niaspan   EKG  Procedure  date:  01/03/2010  Findings:      atrial fibrillation/flutter with underlying ventricular paced rhythm the QRS duration is 214 ms axis is leftward at -78   PPM Specifications Following MD:  Sherryl Manges, MD     Referring MD:  MCDOWELL PPM Vendor:  [employer] Jude     PPM Model Number:  [id number]     PPM Serial Number:  [id number] PPM DOI:  11/15/2007     PPM Implanting MD:  Sherryl Manges, MD  Lead 1    Location: [facility]     DOI: 11/15/2007     Model #: [id number]     Serial #: [id number]     Status: active Lead 2    Location: [facility]     DOI: 11/15/2007     Model #: [id number]     Serial #: [id number]     Status: active   Indications:  PAF WITH PAUSES   PPM Follow Up Pacer Dependent:  No      Episodes Coumadin:  Yes  Parameters Mode:  DDDR     Lower Rate Limit:  60     Upper Rate Limit:  125 Paced AV Delay:  200  Sensed AV Delay:  200

## 2010-08-09 NOTE — Medication Information (Signed)
Summary: [facility]  Anticoagulant Therapy  Managed by: Jeralene Peters, PharmD Referring MD: Nona Dell PCP: [facility] Ernesto Rutherford Supervising MD: Shirlee Latch MD, [place] Indication 1: CVA-stroke (ICD-436) Lab Used: [facility] Site: [facility] INR POC 2.6 INR RANGE 2 - 3  Dietary changes: no    Health status changes: no    Bleeding/hemorrhagic complications: no    Recent/future hospitalizations: no    Any changes in medication regimen? no    Recent/future dental: no  Any missed doses?: no       Is patient compliant with meds? yes       Current Medications (verified): 1)  Tambocor 100 Mg  Tabs (Flecainide Acetate) .... Take 1 Tablet By Mouth Two Times A Day 2)  Metoprolol Succinate 25 Mg  Tb24 (Metoprolol Succinate) .... Take 1/2 Tablet By Mouth Two Times A Day 3)  Requip 2 Mg  Tabs (Ropinirole Hcl) .... Take 1 Tab By Mouth At Bedtime 4)  Cardizem La 180 Mg Xr24h-Tab (Diltiazem Hcl Coated Beads) .... Take 1 Tablet By Mouth Two Times A Day 5)  Zetia 10 Mg  Tabs (Ezetimibe) .... Take 1 Tablet By Mouth Once A Day 6)  Prilosec 40 Mg Cpdr (Omeprazole) .... Once Daily 7)  Fish Oil 1000 Mg Caps (Omega-3 Fatty Acids) .... Take 1 Capsule By Mouth Once A Day 8)  Coumadin 5 Mg Tabs (Warfarin Sodium) .... Per Coumadin Clinic 9)  Multivitamins   Tabs (Multiple Vitamin) .... Take 1 Tablet By Mouth Once A Day 10)  Glucosamine-Chondroitin   Caps (Glucosamine-Chondroit-Vit C-Mn) .... Take 1 Capsule By Mouth Two Times A Day 11)  Nasonex 50 Mcg/act Susp (Mometasone Furoate) .Marland Kitchen.. 1-2 Puffs Each Nostril Daily 12)  Cozaar 50 Mg Tabs (Losartan Potassium) .... Take One Tablet By Mouth Daily 13)  Fenofibrate 160 Mg Tabs (Fenofibrate) .... Take One Tablet By Mouth Daily With A Meal 14)  Crestor 5 Mg Tabs (Rosuvastatin Calcium) .... Take One Tablet By Mouth On Mondays, Wednesdays, Fridays. 15)  Cpap 7 Advanced  Allergies (verified): 1)  ! * Niaspan  Anticoagulation Management History:      The patient is  taking warfarin and comes in today for a routine follow up visit.  Positive risk factors for bleeding include history of CVA/TIA.  Negative risk factors for bleeding include an age less than 109 years old.  The bleeding index is 'intermediate risk'.  Positive CHADS2 values include Prior Stroke/CVA/TIA.  Negative CHADS2 values include Age > 50 years old.  The start date was 01/04/2007.  His last INR was 3.0 RATIO.  Anticoagulation responsible provider: Shirlee Latch MD, [place].  INR POC: 2.6.  Cuvette Lot#: [id number].  Exp: 11/2010.    Anticoagulation Management Assessment/Plan:      The patient's current anticoagulation dose is Coumadin 5 mg tabs: per coumadin clinic.  The target INR is 2 - 3.  The next INR is due 10/04/2009.  Anticoagulation instructions were given to patient.  Results were reviewed/authorized by Jeralene Peters, PharmD.         Prior Anticoagulation Instructions: INR: 3.2 Take 1/2 tablet today, then resume to 5mg  tablet daily except 2.5mg  on Mondays and Thursdays Recheck in 3 weeks   Current Anticoagulation Instructions: INR 2.6  CONTINUE TAKING 1 TABLET EVERYDAY EXCEPT TAKE 1/2 TABLET ON MONDAYS AND THURSDAYS.  RECHECK IN 4 WEEKS.

## 2010-08-09 NOTE — Miscellaneous (Signed)
Summary: Orders Update  Clinical Lists Changes  Orders: Added new Test order of TLB-CBC Platelet - w/Differential (85025-CBCD) - Signed Added new Test order of TLB-Testosterone, Total (84403-TESTO) - Signed

## 2010-08-09 NOTE — Progress Notes (Signed)
Summary: med questions  Phone Note Call from Patient Call back at Work Phone [phone]   Caller: Patient Reason for Call: Talk to Nurse Summary of Call: request to speak to nurse about med dosage....METOPROLOL SUCCINATE 25 MG  TB24 (METOPROLOL SUCCINATE) Initial call taken by: Migdalia Dk,  January 12, 2010 10:28 AM  Follow-up for Phone Call        The pt called today with a question on his metoprolol rx. The pt. states he was on metoprolol tart 50mg  once daily when he came in for his last visit. He was told by Dr. Graciela Husbands that he should have been taking metoprolol tart 50mg  1/2 tab two times a day. I explained that was correct due to the coverage time. He was sent home with a prescription for metoprolol succ 25mg  once daily. I explained to the pt he should be on metoprolol succ 50mg  once daily, but since his SBP was only 114 mmHG at his last visit, he will start out taking metoprolol succ 25mg  once daily. He will record his BP and pulse rates x 1 week, then call us with the readings. If they are ok, we can probably go ahead and change his rx to metoprolol succ 50mg  once daily. The pt is agreeable. Follow-up by: Sherri Rad, RN, BSN,  January 12, 2010 10:48 AM

## 2010-08-09 NOTE — Cardiovascular Report (Signed)
Summary: Office Visit   Office Visit   Imported By: Roderic Ovens 04/05/2010 15:09:50  _____________________________________________________________________  External Attachment:    Type:   Image     Comment:   External Document

## 2010-08-09 NOTE — Medication Information (Signed)
Summary: Coumadin Clinic  Anticoagulant Therapy  Managed by: Inactive Referring MD: Nona Dell PCP: [facility] Ernesto Rutherford Supervising MD: Graciela Husbands MD, Viviann Spare Indication 1: CVA-stroke (ICD-436) Lab Used: [facility] Site: [facility] INR RANGE 2 - 3          Comments: Pt saw Dr Graciela Husbands today in office, d/c Coumadin starting on Pradaxa. Cloyde Reams RN  March 29, 2010 4:47 PM   Allergies: 1)  ! * Niaspan  Anticoagulation Management History:      Positive risk factors for bleeding include history of CVA/TIA.  Negative risk factors for bleeding include an age less than 93 years old.  The bleeding index is 'intermediate risk'.  Positive CHADS2 values include Prior Stroke/CVA/TIA.  Negative CHADS2 values include Age > 38 years old.  The start date was 01/04/2007.  His last INR was 3.0 RATIO.  Anticoagulation responsible provider: Graciela Husbands MD, Viviann Spare.  Exp: 04/2011.    Anticoagulation Management Assessment/Plan:      The patient's current anticoagulation dose is Coumadin 5 mg tabs: per coumadin clinic.  The target INR is 2 - 3.  The next INR is due 04/27/2010.  Anticoagulation instructions were given to patient.  Results were reviewed/authorized by Inactive.         Prior Anticoagulation Instructions: INR 3 Continue 1 tablet everyday except on monday and thursday. On monday and thursday take a half of a tablet. Recheck in 4 weeks.

## 2010-08-09 NOTE — Medication Information (Signed)
Summary: ROV/LB  Anticoagulant Therapy  Managed by: Bethena Midget, RN, BSN Referring MD: Nona Dell PCP: Zenovia Jarred Supervising MD: Ladona Ridgel MD, Sharlot Gowda Indication 1: CVA-stroke (ICD-436) Lab Used: [facility] Site: [facility] INR POC 2.7 INR RANGE 2 - 3  Dietary changes: no    Health status changes: no    Bleeding/hemorrhagic complications: no    Recent/future hospitalizations: no    Any changes in medication regimen? no    Recent/future dental: no  Any missed doses?: no       Is patient compliant with meds? yes       Allergies: 1)  ! * Niaspan  Anticoagulation Management History:      The patient is taking warfarin and comes in today for a routine follow up visit.  Positive risk factors for bleeding include history of CVA/TIA.  Negative risk factors for bleeding include an age less than 60 years old.  The bleeding index is 'intermediate risk'.  Positive CHADS2 values include Prior Stroke/CVA/TIA.  Negative CHADS2 values include Age > 39 years old.  The start date was 01/04/2007.  His last INR was 3.0 RATIO.  Anticoagulation responsible provider: Ladona Ridgel MD, Sharlot Gowda.  INR POC: 2.7.  Cuvette Lot#: [id number].  Exp: 11/2010.    Anticoagulation Management Assessment/Plan:      The patient's current anticoagulation dose is Coumadin 5 mg tabs: per coumadin clinic.  The target INR is 2 - 3.  The next INR is due 11/04/2009.  Anticoagulation instructions were given to patient.  Results were reviewed/authorized by Bethena Midget, RN, BSN.  He was notified by Bethena Midget, RN, BSN.         Prior Anticoagulation Instructions: INR 2.6  CONTINUE TAKING 1 TABLET EVERYDAY EXCEPT TAKE 1/2 TABLET ON MONDAYS AND THURSDAYS.  RECHECK IN 4 WEEKS.    Current Anticoagulation Instructions: INR 2.7 Continue 5mg s daily except 2.5mg s on Mondays and Thursdays. Recheck in 4 weeks.

## 2010-08-09 NOTE — Medication Information (Signed)
Summary: rov/tm  Anticoagulant Therapy  Managed by: Weston Brass, PharmD Referring MD: Nona Dell PCP: [facility] Ernesto Rutherford Supervising MD: Daleen Squibb MD, Maisie Fus Indication 1: CVA-stroke (ICD-436) Lab Used: [facility] Site: [facility] INR POC 3.2 INR RANGE 2 - 3  Dietary changes: no    Health status changes: no    Bleeding/hemorrhagic complications: no    Recent/future hospitalizations: no    Any changes in medication regimen? no    Recent/future dental: no  Any missed doses?: no       Is patient compliant with meds? yes       Allergies: 1)  ! * Niaspan  Anticoagulation Management History:      The patient is taking warfarin and comes in today for a routine follow up visit.  Positive risk factors for bleeding include history of CVA/TIA.  Negative risk factors for bleeding include an age less than 42 years old.  The bleeding index is 'intermediate risk'.  Positive CHADS2 values include Prior Stroke/CVA/TIA.  Negative CHADS2 values include Age > 28 years old.  The start date was 01/04/2007.  His last INR was 3.0 RATIO.  Anticoagulation responsible provider: Daleen Squibb MD, Maisie Fus.  INR POC: 3.2.  Cuvette Lot#: [id number].  Exp: 11/2010.    Anticoagulation Management Assessment/Plan:      The patient's current anticoagulation dose is Coumadin 5 mg tabs: per coumadin clinic.  The target INR is 2 - 3.  The next INR is due 12/02/2009.  Anticoagulation instructions were given to patient.  Results were reviewed/authorized by Weston Brass, PharmD.  He was notified by Weston Brass PharmD.         Prior Anticoagulation Instructions: INR 2.7 Continue 5mg s daily except 2.5mg s on Mondays and Thursdays. Recheck in 4 weeks.   Current Anticoagulation Instructions: INR 3.2  Skip today's dose of Coumadin then resume same dose of 1 tablet every day except 1/2 tablet on Monday and Thursday

## 2010-08-09 NOTE — Letter (Signed)
Summary: Lab Orders  Lab Orders   Imported By: Marylou Mccoy 05/10/2010 15:22:44  _____________________________________________________________________  External Attachment:    Type:   Image     Comment:   External Document

## 2010-08-09 NOTE — Assessment & Plan Note (Signed)
Summary: [name] after pft ///kp   Primary Provider/Referring Provider:  [facility]  CC:  Follow up visit after PFT.  History of Present Illness: 07/24/08-OSA, Chronic Atrial Fib/ pacemaker Doing well with full face mask, cpap 7, Advanced. Sleeping well since mask refit. Hosp since here for pacemaker insertion. Scuba diving still. He doesn't bother with the humidifer attachment. Now incidental sneezing, light tan in morning . Had flu but not H1N1 vax. Had first pneumovax last year. He asks a letter documenting that he is under our care for Sleep apnea.  July 23, 2009- OSA, Atrial Fib/ pacemaker Went to Urgent Care for wheeze, was told CXR clear and was [employer] which helps but doesn't last, He hadn't noted it when working out at gym. on bike or treadmill., or in summer when scuba diving. No chest pain. Has some hx of allergic rhinitis. A few years ago we questioned mild asthma,  but he never found much use for inhaler then. Continues CPAP 7 Advanced.  August 20, 2009- OSA, Atrial fib/ pacemaker Continues to scuba dive.Comes for PFT review. Feels fine. Treadmill up to 3/4 mile. He is girls Product manager. 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 PFT- Entirely within normal    Current Medications (verified): 1)  Tambocor 100 Mg  Tabs (Flecainide Acetate) .... Take 1 Tablet By Mouth Two Times A Day 2)  Metoprolol Succinate 25 Mg  Tb24 (Metoprolol Succinate) .... Take 1/2 Tablet By Mouth Two Times A Day 3)  Requip 2 Mg  Tabs (Ropinirole Hcl) .... Take 1 Tab By Mouth At Bedtime 4)  Cardizem La 180 Mg Xr24h-Tab (Diltiazem Hcl Coated Beads) .... Take 1 Tablet By Mouth Two Times A Day 5)  Zetia 10 Mg  Tabs (Ezetimibe) .... Take 1 Tablet By Mouth Once A Day 6)  Prilosec 40 Mg Cpdr (Omeprazole) .... Once Daily 7)  Fish Oil 1000 Mg Caps (Omega-3 Fatty Acids) .... Take 1 Capsule By Mouth Once A Day 8)  Coumadin 5 Mg Tabs (Warfarin Sodium) .... Per Coumadin Clinic 9)  Multivitamins   Tabs (Multiple  Vitamin) .... Take 1 Tablet By Mouth Once A Day 10)  Glucosamine-Chondroitin   Caps (Glucosamine-Chondroit-Vit C-Mn) .... Take 1 Capsule By Mouth Two Times A Day 11)  Nasonex 50 Mcg/act Susp (Mometasone Furoate) .Marland Kitchen.. 1-2 Puffs Each Nostril Daily 12)  Cozaar 50 Mg Tabs (Losartan Potassium) .... Take One Tablet By Mouth Daily 13)  Fenofibrate 160 Mg Tabs (Fenofibrate) .... Take One Tablet By Mouth Daily With A Meal 14)  Crestor 5 Mg Tabs (Rosuvastatin Calcium) .... Take One Tablet By Mouth On Mondays, Wednesdays, Fridays. 15)  Cpap 7 Advanced  Allergies (verified): 1)  ! 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  The patient denies anorexia, fever, weight loss, weight gain, vision loss, decreased hearing, hoarseness, chest pain, syncope, peripheral edema, prolonged cough, headaches,  hemoptysis, and severe indigestion/heartburn.    Vital Signs:  Patient profile:   65 year old male Height:      70 inches Weight:      207 pounds BMI:     29.81 O2 Sat:      97 % on Room air Pulse rate:   70 / minute BP sitting:   112 / 76  (left arm) Cuff size:   regular  Vitals Entered By: Reynaldo Minium CMA (August 20, 2009 4:06 PM)  O2 Flow:  Room air  Physical Exam  Additional Exam:  General: A/Ox3; pleasant and  cooperative, NAD, overweight SKIN: no rash, lesions NODES: no lymphadenopathy HEENT: /AT, EOM- WNL, Conjuctivae- clear, PERRLA, TM-WNL, Nose- clear, Throat- clear and wnl, Mellampatti  III NECK: Supple w/ fair ROM, JVD- none, normal carotid impulses w/o bruits Thyroid- CHEST: Clear to P&A, no wheeze heard HEART: RRR, no m/g/r heard- very regular now ABDOMEN: Soft and nl;  KVQ:QVZD, nl pulses, no edema  NEURO: Grossly intact to observation      Impression & Recommendations:  Problem # 1:  SLEEP APNEA (ICD-780.57)  Good compliance and control   Problem # 2:  ASTHMA (ICD-493.90) PFT is entirely normal and pulse feel like sinus rhythm now. I explained dyspnea with exertion as reflecting his cardiac meds and occasionally his rhythm, also the extra weigtht he is carrying. If needed, Dr Graciela Husbands may consider a cardiopulmonary stress test for dyspnea assessment.   I don't think he needs additional asthma intervention now. At most he would have mild intermittent asthma.  Other Orders: Est. Patient Level III (63875)  Patient Instructions: 1)  Schedule return in one year, earlier if needed 2)  Continue CPAP

## 2010-08-09 NOTE — Medication Information (Signed)
Summary: rov/cb  Anticoagulant Therapy  Managed by: Weston Brass, PharmD Referring MD: Nona Dell PCP: [facility] Ernesto Rutherford Supervising MD: Juanda Chance MD, Afomia Blackley Indication 1: CVA-stroke (ICD-436) Lab Used: [facility] Site: [facility] INR POC 3.3 INR RANGE 2 - 3  Dietary changes: yes       Details: has had more wine in last three days  Health status changes: no    Bleeding/hemorrhagic complications: no    Recent/future hospitalizations: no    Any changes in medication regimen? no    Recent/future dental: no  Any missed doses?: no       Is patient compliant with meds? yes       Allergies: 1)  ! * Niaspan  Anticoagulation Management History:      The patient is taking warfarin and comes in today for a routine follow up visit.  Positive risk factors for bleeding include history of CVA/TIA.  Negative risk factors for bleeding include an age less than 45 years old.  The bleeding index is 'intermediate risk'.  Positive CHADS2 values include Prior Stroke/CVA/TIA.  Negative CHADS2 values include Age > 67 years old.  The start date was 01/04/2007.  His last INR was 3.0 RATIO.  Anticoagulation responsible provider: Juanda Chance MD, Smitty Cords.  INR POC: 3.3.  Cuvette Lot#: [id number].  Exp: 04/2011.    Anticoagulation Management Assessment/Plan:      The patient's current anticoagulation dose is Coumadin 5 mg tabs: per coumadin clinic.  The target INR is 2 - 3.  The next INR is due 03/01/2010.  Anticoagulation instructions were given to patient.  Results were reviewed/authorized by Weston Brass, PharmD.  He was notified by Dillard Cannon.         Prior Anticoagulation Instructions: INR 2.7. Take 1 tablet daily except 0.5 tablet on Mon and Thurs. Recheck in 4 weeks.  Current Anticoagulation Instructions: INR 3.3  Take 1/2 tab tonight.  Then resume normal regimen of 1/2 tab on Monday and Thursday and 1 tab all other days.  Re-check in 4 weeks.

## 2010-08-09 NOTE — Assessment & Plan Note (Signed)
Summary: 12 months/apc   Visit Type:  annual checkup Primary Provider/Referring Provider:  [facility]  CC:  patient has asthma periodically.  History of Present Illness: 07/25/07- Sleep apnea control fine. Uses CPAP 7 every night even on trips- Advanced- full face.  Denies snore, witnessed apnea, nonrestorative sleep, eds. Some nasal congestion-to restart humidifier Restless legs well controlled by Requip taken 8 PM  07/24/08-OSA, Chronic Atrial Fib/ pacemaker Doing well with full face mask, cpap 7, Advanced. Sleeping well since mask refit. Hosp since here for pacemaker insertion. Scuba diving still. He doesn't bother with the humidifer attachment. Now incidental sneezing, light tan in morning . Had flu but not H1N1 vax. Had first pneumovax last year. He asks a letter documenting that he is under our care for Sleep apnea.  July 23, 2009- OSA, Atrial Fib/ pacemaker Went to Urgent Care for wheeze, was told CXR clear and was [employer] which helps but doesn't last, He hadn't noted it when working out at gym. on bike or treadmill., or in summer when scuba diving. No chest pain. Has some hx of allergic rhinitis. A few years ago we questioned mild asthma,  but he never found much use for inhaler then. Continues CPAP 7 Advanced.    Current Medications (verified): 1)  Tambocor 100 Mg  Tabs (Flecainide Acetate) .... Take 1 Tablet By Mouth Two Times A Day 2)  Metoprolol Succinate 25 Mg  Tb24 (Metoprolol Succinate) .... Take 1/2 Tablet By Mouth Two Times A Day 3)  Requip 2 Mg  Tabs (Ropinirole Hcl) .... Take 1 Tab By Mouth At Bedtime 4)  Cardizem La 180 Mg Xr24h-Tab (Diltiazem Hcl Coated Beads) .... Take 1 Tablet By Mouth Two Times A Day 5)  Zetia 10 Mg  Tabs (Ezetimibe) .... Take 1 Tablet By Mouth Once A Day 6)  Prilosec 40 Mg Cpdr (Omeprazole) .... Once Daily 7)  Fish Oil 1000 Mg Caps (Omega-3 Fatty Acids) .... Take 1 Capsule By Mouth Once A Day 8)  Coumadin 5 Mg Tabs (Warfarin Sodium)  .... Per Coumadin Clinic 9)  Multivitamins   Tabs (Multiple Vitamin) .... Take 1 Tablet By Mouth Once A Day 10)  Glucosamine-Chondroitin   Caps (Glucosamine-Chondroit-Vit C-Mn) .... Take 1 Capsule By Mouth Two Times A Day 11)  Nasonex 50 Mcg/act Susp (Mometasone Furoate) .Marland Kitchen.. 1-2 Puffs Each Nostril Daily 12)  Cozaar 50 Mg Tabs (Losartan Potassium) .... Take One Tablet By Mouth Daily 13)  Fenofibrate 160 Mg Tabs (Fenofibrate) .... Take One Tablet By Mouth Daily With A Meal 14)  Crestor 5 Mg Tabs (Rosuvastatin Calcium) .... Take One Tablet By Mouth On Mondays, Wednesdays, Fridays.  Allergies (verified): 1)  ! 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    Vital  Signs:  Patient profile:   65 year old male Weight:      212.38 pounds BMI:     30.58 O2 Sat:      97 % on Room air Pulse rate:   72 / minute BP sitting:   100 / 70  (left arm) Cuff size:   regular  Vitals Entered By: Clarise Cruz Duncan Dull) (July 23, 2009 9:52 AM)  O2 Flow:  Room air  Physical Exam  Additional Exam:  General: A/Ox3; pleasant  and cooperative, NAD, SKIN: no rash, lesions NODES: no lymphadenopathy HEENT: [place]/AT, EOM- WNL, Conjuctivae- clear, PERRLA, TM-WNL, Nose- clear, Throat- clear and wnl, Mellampatti  III NECK: Supple w/ fair ROM, JVD- none, normal carotid impulses w/o bruits Thyroid- CHEST: Clear to P&A, no wheeze heard HEART: RRR, no m/g/r heard ABDOMEN: Soft and nl;  GBT:DVVO, nl pulses, no edema  NEURO: Grossly intact to observation      Impression & Recommendations:  Problem # 1:  ASTHMA (ICD-493.90) We Will schedule PFT and ask for CXR report from [facility]  Problem # 2:  SLEEP APNEA (ICD-780.57)  Good compliance and control, but with some mask comfort issues which we discussed.  Medications Added to Medication List This Visit: 1)  Prilosec 40 Mg Cpdr (Omeprazole) .... Once daily 2)  Cpap 7 Advanced   Other Orders: Est. Patient Level III (16073)  Patient Instructions: 1)  Please schedule a follow-up appointment in 1 month. 2)  Schedule PFT 3)  We will get CXR report from [facility].  Appended Document: 12 months/apc Spoke with Rodi at [facility]; faxing CXR report. Will place in [facility]'s look at as requested.

## 2010-08-09 NOTE — Medication Information (Signed)
Summary: rov/sp  Anticoagulant Therapy  Managed by: Eda Keys, PharmD Referring MD: Nona Dell PCP: [facility] Ernesto Rutherford Supervising MD: Daleen Squibb MD, Maisie Fus Indication 1: CVA-stroke (ICD-436) Lab Used: [facility] Site: [facility] INR POC 2.8 INR RANGE 2 - 3  Dietary changes: no    Health status changes: no    Bleeding/hemorrhagic complications: no    Recent/future hospitalizations: no    Any changes in medication regimen? no    Recent/future dental: no  Any missed doses?: no       Is patient compliant with meds? yes       Allergies: 1)  ! * Niaspan  Anticoagulation Management History:      The patient is taking warfarin and comes in today for a routine follow up visit.  Positive risk factors for bleeding include history of CVA/TIA.  Negative risk factors for bleeding include an age less than 52 years old.  The bleeding index is 'intermediate risk'.  Positive CHADS2 values include Prior Stroke/CVA/TIA.  Negative CHADS2 values include Age > 48 years old.  The start date was 01/04/2007.  His last INR was 3.0 RATIO.  Anticoagulation responsible provider: Daleen Squibb MD, Maisie Fus.  INR POC: 2.8.  Cuvette Lot#: [id number].  Exp: 08/12.    Anticoagulation Management Assessment/Plan:      The patient's current anticoagulation dose is Coumadin 5 mg tabs: per coumadin clinic.  The target INR is 2 - 3.  The next INR is due 01/03/2010.  Anticoagulation instructions were given to patient.  Results were reviewed/authorized by Eda Keys, PharmD.  He was notified by Eda Keys, PharmD.         Prior Anticoagulation Instructions: INR 3.2  Skip today's dose of Coumadin then resume same dose of 1 tablet every day except 1/2 tablet on Monday and Thursday   Current Anticoagulation Instructions: INR 2.8  Continue taking 1/2 tablet on monday and Thursday and 1 tablet all other days.  Return to clinic in 4 weeks.

## 2010-08-09 NOTE — Medication Information (Signed)
Summary: rov/ln  Anticoagulant Therapy  Managed by: Weston Brass, PharmD Referring MD: Nona Dell PCP: [facility] Ernesto Rutherford Supervising MD: Shirlee Latch MD, Freida Busman Indication 1: CVA-stroke (ICD-436) Lab Used: [facility] Site: [facility] INR POC 2.5 INR RANGE 2 - 3  Dietary changes: no    Health status changes: no    Bleeding/hemorrhagic complications: no    Recent/future hospitalizations: no    Any changes in medication regimen? no    Recent/future dental: no  Any missed doses?: no       Is patient compliant with meds? yes       Allergies: 1)  ! * Niaspan  Anticoagulation Management History:      The patient is taking warfarin and comes in today for a routine follow up visit.  Positive risk factors for bleeding include history of CVA/TIA.  Negative risk factors for bleeding include an age less than 2 years old.  The bleeding index is 'intermediate risk'.  Positive CHADS2 values include Prior Stroke/CVA/TIA.  Negative CHADS2 values include Age > 53 years old.  The start date was 01/04/2007.  His last INR was 3.0 RATIO.  Anticoagulation responsible provider: Shirlee Latch MD, Truett Mcfarlan.  INR POC: 2.5.  Cuvette Lot#: [id number].  Exp: 04/2011.    Anticoagulation Management Assessment/Plan:      The patient's current anticoagulation dose is Coumadin 5 mg tabs: per coumadin clinic.  The target INR is 2 - 3.  The next INR is due 03/30/2010.  Anticoagulation instructions were given to patient.  Results were reviewed/authorized by Weston Brass, PharmD.  He was notified by Gweneth Fritter, PharmD Candidate.         Prior Anticoagulation Instructions: INR 3.3  Take 1/2 tab tonight.  Then resume normal regimen of 1/2 tab on Monday and Thursday and 1 tab all other days.  Re-check in 4 weeks.  Current Anticoagulation Instructions: INR 2.5  Continue taking 1 tablet (5mg ) every day except take 1/2 tablet (2.5mg ) on Mondays and Thursdays.  Recheck in 4 weeks.

## 2010-08-09 NOTE — Assessment & Plan Note (Signed)
Summary: pc2 check   Visit Type:  Follow-up Primary Provider:  [facility]   History of Present Illness: [name] is seen in followup for atrial fibrillation for which he takes flecainide. He had posttermination pauses and has undergone pacemaker implantation. He has had no recurrent syncope.  He has noted some issues with irregularity which he treated to recurrence of atrial fibrillation; he is also noted some issues with exercise intolerance manifested primarily when climbing stairs. He says he is able to run and lift weights without shortness of breath with climbing stairs can be an issue.    He has had no  symptomatic atrial fibrillation   He continues to scuba dive avidly   Current Medications (verified): 1)  Tambocor 100 Mg  Tabs (Flecainide Acetate) .... Take 1 Tablet By Mouth Two Times A Day 2)  Metoprolol Succinate 25 Mg  Tb24 (Metoprolol Succinate) .... Take One Tablet Once Daily 3)  Requip 2 Mg  Tabs (Ropinirole Hcl) .... Take 1 Tab By Mouth At Bedtime 4)  Matzim La 180 Mg Xr24h-Tab (Diltiazem Hcl Coated Beads) .... Take One Tablet Two Times A Day 5)  Zetia 10 Mg  Tabs (Ezetimibe) .... Take 1 Tablet By Mouth Once A Day 6)  Prilosec 40 Mg Cpdr (Omeprazole) .... Once Daily 7)  Fish Oil 1000 Mg Caps (Omega-3 Fatty Acids) .... Take 1 Capsule By Mouth Two Times A Day 8)  Coumadin 5 Mg Tabs (Warfarin Sodium) .... Per Coumadin Clinic 9)  Multivitamins   Tabs (Multiple Vitamin) .... Take 1 Tablet By Mouth Once A Day 10)  Glucosamine-Chondroitin   Caps (Glucosamine-Chondroit-Vit C-Mn) .... Take 1 Capsule By Mouth Two Times A Day 11)  Nasonex 50 Mcg/act Susp (Mometasone Furoate) .Marland Kitchen.. 1-2 Puffs Each Nostril Daily 12)  Cozaar 50 Mg Tabs (Losartan Potassium) .... Take One Tablet By Mouth Daily 13)  Fenofibrate 160 Mg Tabs (Fenofibrate) .... Take One Tablet By Mouth Daily With A Meal 14)  Cpap 7 Advanced 15)  Vitamin D3 1000 Unit Tabs (Cholecalciferol) .... Take One Tablet Once  Daily 16)  Androgel 25 Mg/2.5gm Gel (Testosterone) .... Uad  Allergies: 1)  ! * Niaspan  Vital Signs:  Patient profile:   65 year old male Height:      70 inches Weight:      213 pounds BMI:     30.67 Pulse rate:   60 / minute BP sitting:   138 / 80  (left arm)  Vitals Entered By: Laurance Flatten CMA (March 29, 2010 3:45 PM)  Physical Exam  General:  The patient was alert and oriented in no acute distress. HEENT Normal.  Neck veins were flat, carotids were brisk.  Lungs were clear.  Heart sounds were regular without murmurs or gallops.  Abdomen was soft with active bowel sounds. There is no clubbing cyanosis or edema. Skin Warm and dry with nonpalpable petechiae    PPM Specifications Following MD:  Sherryl Manges, MD     Referring MD:  MCDOWELL PPM Vendor:  [facility] [employer]     PPM Model Number:  [id number]     PPM Serial Number:  [id number] PPM DOI:  11/15/2007     PPM Implanting MD:  Sherryl Manges, MD  Lead 1    Location: RA     DOI: 11/15/2007     Model #: [id number]     Serial #: [id number]     Status: active Lead 2    Location: RV     DOI: 11/15/2007  Model #: [id number]     Serial #: [id number]     Status: active   Indications:  PAF WITH PAUSES   PPM Follow Up Battery Voltage:  2.76 V     Battery Est. Longevity:  4.75-6.75 yrs     Pacer Dependent:  No       PPM Device Measurements Atrium  Amplitude: 3.4 mV, Impedance: 565 ohms, Threshold: 1.00 V at 0.5 msec Right Ventricle  Amplitude: 10.5 mV, Impedance: 500 ohms, Threshold: 1.25 V at 0.5 msec  Episodes MS Episodes:  27     Percent Mode Switch:  59%     Coumadin:  Yes Ventricular High Rate:  0     Atrial Pacing:  30%     Ventricular Pacing:  49%  Parameters Mode:  DDIR     Lower Rate Limit:  60     Upper Rate Limit:  125 Paced AV Delay:  200     Sensed AV Delay:  200 Next Cardiology Appt Due:  05/10/2010 Tech Comments:  59% OF TIME IN AF.  + COUMADIN AND FLECANIDE.  NORMAL DEVICE FUNCTION.  TURNED AEGMs ON AND CHANGED RA OUTPUT FROM  2.50 TO 2.00 V. ROV IN 2 MTHS. (CONSIDER TURNING OFF AEGM STORAGE AT NEXT CHECK)  [name]  March 29, 2010 4:27 PM  Impression & Recommendations:  Problem # 1:  FIBRILLATION, ATRIAL (ICD-427.31) MDale continues to have episodes of atrial arrhythmias detected by his pacemaker. They are distributed in a bimodal way with a cluster between 180 and 225 and another cluster of 300;  we unfortunately do not have rate 80 in the context of his episodes because of his DDI programming however, he has been out of rhythm up proximally 50% of the time over the last 3 months and there is no significant right shifting of his heart rate histograms suggesting adequacy of rate control. This volume of atrial fibrillationis not was standing the up titration of his flecainide from 50-100 b.i.d.  We had a lengthy discussion regarding the relative merits of Coumadin versus Pradaxa. These included a relative benefits as well as risks. The patient would like to begin on Pradaxa. This discussion took greater than 10 minutes  We will also change  back to metoprolol tartrate to see if the b.i.d. dosing/formulation which is taking previously is more effective  His updated medication list for this problem includes:    Tambocor 100 Mg Tabs (Flecainide acetate) .Marland Kitchen... Take 1 tablet by mouth two times a day    Metoprolol Succinate 25 Mg Tb24 (Metoprolol succinate) .Marland Kitchen... Take one tablet once daily    Coumadin 5 Mg Tabs (Warfarin sodium) .Marland Kitchen... Per coumadin clinic  Problem # 2:  PETECHIAE (ICD-782.7) this appears to be a capillitis . My  recollection is the essential issue is to exclude from the cytopenias the cause for this petechial-like rash. Will check a platelet count   Problem # 3:  PACEMAKER, PERMANENT  ST JUDES (ICD-V45.01) Device parameters and data were reviewed and no changes were made   Orders: TLB-CBC Platelet - w/Differential ([id number]-[id number])  Problem # 4:  SYNCOPE (ICD-780.2) nrecurrrent syncope His  updated medication list for this problem includes:    Tambocor 100 Mg Tabs (Flecainide acetate) .Marland Kitchen... Take 1 tablet by mouth two times a day    Metoprolol Succinate 25 Mg Tb24 (Metoprolol succinate) .Marland Kitchen... Take one tablet once daily    Matzim La 180 Mg Xr24h-tab (Diltiazem hcl coated beads) .Marland Kitchen... Take one tablet two times a  day    Coumadin 5 Mg Tabs (Warfarin sodium) .Marland Kitchen... Per coumadin clinic  Patient Instructions: 1)  Your physician recommends that you schedule a follow-up appointment in: 2 months 2)  Your physician recommends that you CBC today. 3)  Your physician has recommended you make the following change in your medication: Pradaxa-one tablet twice a day. Discontinue Coumadin.

## 2010-08-09 NOTE — Medication Information (Signed)
Summary: rov/eac  Anticoagulant Therapy  Managed by: Elaina Pattee, PharmD Referring MD: Nona Dell PCP: Zenovia Jarred Supervising MD: Jens Som MD, Arlys John Indication 1: CVA-stroke (ICD-436) Lab Used: [facility] Site: [facility] INR POC 2.7 INR RANGE 2 - 3  Dietary changes: no    Health status changes: no    Bleeding/hemorrhagic complications: no    Recent/future hospitalizations: no    Any changes in medication regimen? no    Recent/future dental: no  Any missed doses?: no       Is patient compliant with meds? yes       Allergies: 1)  ! * Niaspan  Anticoagulation Management History:      The patient is taking warfarin and comes in today for a routine follow up visit.  Positive risk factors for bleeding include history of CVA/TIA.  Negative risk factors for bleeding include an age less than 52 years old.  The bleeding index is 'intermediate risk'.  Positive CHADS2 values include Prior Stroke/CVA/TIA.  Negative CHADS2 values include Age > 29 years old.  The start date was 01/04/2007.  His last INR was 3.0 RATIO.  Anticoagulation responsible provider: Jens Som MD, Arlys John.  INR POC: 2.7.  Cuvette Lot#: [id number].  Exp: 08/12.    Anticoagulation Management Assessment/Plan:      The patient's current anticoagulation dose is Coumadin 5 mg tabs: per coumadin clinic.  The target INR is 2 - 3.  The next INR is due 01/31/2010.  Anticoagulation instructions were given to patient.  Results were reviewed/authorized by Elaina Pattee, PharmD.  He was notified by Elaina Pattee, PharmD.         Prior Anticoagulation Instructions: INR 2.8  Continue taking 1/2 tablet on monday and Thursday and 1 tablet all other days.  Return to clinic in 4 weeks.    Current Anticoagulation Instructions: INR 2.7. Take 1 tablet daily except 0.5 tablet on Mon and Thurs. Recheck in 4 weeks. Prescriptions: COUMADIN 5 MG TABS (WARFARIN SODIUM) per coumadin clinic  #30 x 2   Entered by:   Elaina Pattee  Authorized by:   Nathen May, MD, [facility]   Signed by:   Elaina Pattee on 01/03/2010   Method used:   Electronically to        [facility]  [address] [phone]* (retail)       [address]       [place], [place]  [id number]       Ph: [phone] or [phone]       Fax: [phone]   RxID:   [id number]

## 2010-08-09 NOTE — Assessment & Plan Note (Signed)
Summary: f29m   Visit Type:  2 mo f/u Primary Provider:  [facility]  CC:  denies any cardiac complaints today.  History of Present Illness: Mr. Mayall is seen in followup for atrial fibrillation for which he takes flecainide. He had posttermination pauses and has undergone pacemaker implantation. He has had no recurrent syncope.  He has noted some issues with irregularity which he treated to recurrence of atrial fibrillation; he is also noted some issues with exercise intolerance manifested primarily when climbing stairs. He says he is able to run and lift weights without shortness of breath with climbing stairs can be an issue.    He has had no  symptomatic atrial fibrillation although there is some symptoms of fatigue it is not clear to him whether this is related to his fibrillation or not.       Current Medications (verified): 1)  Tambocor 100 Mg  Tabs (Flecainide Acetate) .... Take 1 Tablet By Mouth Two Times A Day 2)  Requip 2 Mg  Tabs (Ropinirole Hcl) .... Take 1 Tab By Mouth At Bedtime 3)  Matzim La 180 Mg Xr24h-Tab (Diltiazem Hcl Coated Beads) .... Take One Tablet Two Times A Day 4)  Zetia 10 Mg  Tabs (Ezetimibe) .... Take 1 Tablet By Mouth Once A Day 5)  Prilosec 40 Mg Cpdr (Omeprazole) .... Once Daily 6)  Fish Oil 1000 Mg Caps (Omega-3 Fatty Acids) .... Take 1 Capsule By Mouth Two Times A Day 7)  Pradaxa 150 Mg Caps (Dabigatran Etexilate Mesylate) .... Two Times A Day 8)  Multivitamins   Tabs (Multiple Vitamin) .... Take 1 Tablet By Mouth Once A Day 9)  Glucosamine-Chondroitin   Caps (Glucosamine-Chondroit-Vit C-Mn) .... Take 1 Capsule By Mouth Two Times A Day 10)  Nasonex 50 Mcg/act Susp (Mometasone Furoate) .Marland Kitchen.. 1-2 Puffs Each Nostril Daily 11)  Cozaar 50 Mg Tabs (Losartan Potassium) .... Take One Tablet By Mouth Daily 12)  Fenofibrate 160 Mg Tabs (Fenofibrate) .... Take One Tablet By Mouth Daily With A Meal 13)  Cpap 7 Advanced 14)  Vitamin D3 1000 Unit Tabs  (Cholecalciferol) .... Take One Tablet Once Daily 15)  Androgel 25 Mg/2.5gm Gel (Testosterone) .... Uad 16)  Metoprolol Tartrate 25 Mg Tabs (Metoprolol Tartrate) .... Take One Tablet Two Times A Day  Allergies: 1)  ! * Niaspan  Vital Signs:  Patient profile:   65 year old male Height:      70 inches Weight:      217.4 pounds BMI:     31.31 Pulse rate:   60 / minute Pulse rhythm:   regular BP sitting:   128 / 58  (left arm) Cuff size:   large  Vitals Entered By: Danielle Rankin, CMA (June 01, 2010 3:00 PM)  Physical Exam  General:  The patient was alert and oriented in no acute distress. HEENT Normal.  Neck veins were flat, carotids were brisk.  Lungs were clear.  Heart sounds were regular without murmurs or gallops.  Abdomen was soft with active bowel sounds. There is no clubbing cyanosis or edema. Skin Warm and dry    PPM Specifications Following MD:  Sherryl Manges, MD     Referring MD:  MCDOWELL PPM Vendor:  [facility] [employer]     PPM Model Number:  [id number]     PPM Serial Number:  [id number] PPM DOI:  11/15/2007     PPM Implanting MD:  Sherryl Manges, MD  Lead 1    Location: RA  DOI: 11/15/2007     Model #: [id number]     Serial #: [id number]     Status: active Lead 2    Location: RV     DOI: 11/15/2007     Model #: [id number]     Serial #: [id number]     Status: active   Indications:  PAF WITH PAUSES   PPM Follow Up Battery Voltage:  2.76 V     Battery Est. Longevity:  3.75-5.50 YRS     Pacer Dependent:  No       PPM Device Measurements Atrium  Amplitude: 3.5 mV, Impedance: 503 ohms, Threshold: 0.75 V at 0.5 msec Right Ventricle  Amplitude: 10.0 mV, Impedance: 453 ohms, Threshold: 1.00 V at 0.5 msec  Episodes MS Episodes:  30     Percent Mode Switch:  67%     Coumadin:  Yes Ventricular High Rate:  0     Atrial Pacing:  23%     Ventricular Pacing:  52%  Parameters Mode:  DDIR     Lower Rate Limit:  60     Upper Rate Limit:  125 Paced AV Delay:  200     Sensed AV Delay:  200 Next  Cardiology Appt Due:  11/08/2010 Tech Comments:  PT IN AF 67% OF TIME. +PRADAXA.  NORMAL DEVICE FUNCTION.  NO CHANGES MADE. ROV IN 6 MTHS W/DEVICE CLINIC. [name]  June 01, 2010 3:31 PM  Impression & Recommendations:  Problem # 1:  ATRIAL FLUTTER (ICD-427.32) the patient has recurrent atrial arrhythmias constituting about 65% his time. Hence it is fair to say that the flecainide is not having a major benefit. The issue on the tables are 2 the first is whether his fatigue correlates with his fibrillation. As he is programmed in a DDI mode, no be no way to tell by great whether he is iell by regularity. And he will take his pulse and see if it can be correlated regularity with symptoms. The second issue is whether there is forthcoming data from the [facility] Trial which is randomized ablation versus antiarrhythmic drugs for major outcome. For right now we will continue the flecainide His updated medication list for this problem includes:    Tambocor 100 Mg Tabs (Flecainide acetate) .Marland Kitchen... Take 1 tablet by mouth two times a day    Metoprolol Tartrate 25 Mg Tabs (Metoprolol tartrate) .Marland Kitchen... Take one tablet two times a day  Problem # 2:  PACEMAKER, PERMANENT  ST JUDES (ICD-V45.01) Device parameters and data were reviewed and no changes were made  Problem # 3:  FIBRILLATION, ATRIAL (ICD-427.31) as above; he is also on Pradaxa as he has a CHADS VASC score of 3 with hypertension and prior stroke His updated medication list for this problem includes:    Tambocor 100 Mg Tabs (Flecainide acetate) .Marland Kitchen... Take 1 tablet by mouth two times a day    Metoprolol Tartrate 25 Mg Tabs (Metoprolol tartrate) .Marland Kitchen... Take one tablet two times a day  Patient Instructions: 1)  Your physician recommends that you continue on your current medications as directed. Please refer to the Current Medication list given to you today. 2)  Your physician wants you to follow-up in: 6 months.You will receive a reminder letter in  the mail two months in advance. If you don't receive a letter, please call our office to schedule the follow-up appointment.

## 2010-08-15 ENCOUNTER — Ambulatory Visit: Payer: Self-pay | Admitting: Internal Medicine

## 2010-09-07 ENCOUNTER — Telehealth (INDEPENDENT_AMBULATORY_CARE_PROVIDER_SITE_OTHER): Payer: Self-pay | Admitting: *Deleted

## 2010-09-09 ENCOUNTER — Ambulatory Visit: Payer: Self-pay | Admitting: Internal Medicine

## 2010-09-15 NOTE — Progress Notes (Signed)
Summary: appt  Phone Note Call from Patient Call back at Work Phone [phone]   Caller: Patient Call For: young Summary of Call: Pt had appt 4/6 had to cancel (office closed) need to reschedule for 4:30 sometime in April pls advise. Initial call taken by: Darletta Moll,  September 07, 2010 3:34 PM  Follow-up for Phone Call        Called, spoke with pt.  His appt on 4/6 with CY had to be moved bc office will be closed (Good Friday).  He is requesting an appt in April at 4:30.  Dr. Delena Bali, pls advise if pt can be worked in at this time sometime in April.  Thanks! Follow-up by: Gweneth Dimitri RN,  September 07, 2010 3:59 PM  Additional Follow-up for Phone Call Additional follow up Details #1::        I spoke with patient-appt made for Tuesday 10-20-2010 at 4pm-pt will call when on way to appt as he works in [facility] and travels to our office.Reynaldo Minium CMA  September 07, 2010 4:19 PM

## 2010-09-21 ENCOUNTER — Encounter: Payer: Self-pay | Admitting: Gastroenterology

## 2010-09-27 NOTE — Letter (Signed)
Summary: Endoscopy Letter  [facility]  [address] [place], [place] [address]   Phone: [phone]  Fax: [phone]      September 21, 2010 MRN: [id number]   [name] [address] [place], [place]  [address]   Dear [name],   According to your medical record, it is time for you to schedule an Endoscopy. Endoscopic screening is recommended for patients with certain upper digestive tract conditions because of associated increased risk for cancers of the upper digestive system.  This letter has been generated based on the recommendations made at the time of your prior procedure. If you feel that in your particular situation this may no longer apply, please contact our office.  Please call our office at [phone]) to schedule this appointment or to update your records at your earliest convenience.  Thank you for cooperating with [place] to provide you with the very best care possible.   Sincerely,  Judie Petit T. [name], M.D.  [facility] [phone]

## 2010-10-14 ENCOUNTER — Ambulatory Visit: Payer: Self-pay | Admitting: Internal Medicine

## 2010-10-19 ENCOUNTER — Encounter: Payer: Self-pay | Admitting: Internal Medicine

## 2010-10-20 ENCOUNTER — Ambulatory Visit: Payer: Self-pay | Admitting: Internal Medicine

## 2010-10-27 ENCOUNTER — Telehealth: Payer: Self-pay | Admitting: Internal Medicine

## 2010-10-27 NOTE — Telephone Encounter (Signed)
Ok if we can do it.

## 2010-10-27 NOTE — Telephone Encounter (Signed)
Called and spoke with pt. Pt was scheduled to see CY last week but was out of town in [place].  Pt states his next time he is able to take off from work is 11-16-2010 and pt is requesting an appt with CY that morning.  NO appts currently avail.  Please advise.  Thanks.

## 2010-10-31 NOTE — Telephone Encounter (Signed)
Please let pt know I have him an appt 11-16-2010 at 1115am with [facility]-there may be a wait for him to be seen as pt is being worked in.

## 2010-10-31 NOTE — Telephone Encounter (Signed)
Called spoke with patient, advised of work-in appt w/ CDY on 5.9.12 and possible wait.  Pt okay with this and verbalized his understanding.

## 2010-11-15 ENCOUNTER — Encounter: Payer: Self-pay | Admitting: Internal Medicine

## 2010-11-16 ENCOUNTER — Encounter: Payer: Self-pay | Admitting: Gastroenterology

## 2010-11-16 ENCOUNTER — Encounter: Payer: Self-pay | Admitting: Internal Medicine

## 2010-11-16 ENCOUNTER — Ambulatory Visit (INDEPENDENT_AMBULATORY_CARE_PROVIDER_SITE_OTHER): Payer: BC Managed Care – PPO | Admitting: Gastroenterology

## 2010-11-16 ENCOUNTER — Ambulatory Visit (INDEPENDENT_AMBULATORY_CARE_PROVIDER_SITE_OTHER): Payer: BC Managed Care – PPO | Admitting: Internal Medicine

## 2010-11-16 VITALS — BP 122/80 | HR 70 | Ht 70.0 in | Wt 208.0 lb

## 2010-11-16 VITALS — BP 118/68 | HR 68 | Ht 70.0 in | Wt 206.0 lb

## 2010-11-16 DIAGNOSIS — K2270 Barrett's esophagus without dysplasia: Secondary | ICD-10-CM

## 2010-11-16 DIAGNOSIS — Z7901 Long term (current) use of anticoagulants: Secondary | ICD-10-CM

## 2010-11-16 DIAGNOSIS — Z860100 Personal history of colon polyps, unspecified: Secondary | ICD-10-CM

## 2010-11-16 DIAGNOSIS — J45909 Unspecified asthma, uncomplicated: Secondary | ICD-10-CM

## 2010-11-16 DIAGNOSIS — G4733 Obstructive sleep apnea (adult) (pediatric): Secondary | ICD-10-CM

## 2010-11-16 MED ORDER — PEG-KCL-NACL-NASULF-NA ASC-C 100 G PO SOLR: 1.0000 | Freq: Once | ORAL | Status: AC

## 2010-11-16 MED ORDER — MOMETASONE FUROATE 50 MCG/ACT NA SUSP: 2.0000 | Freq: Every day | NASAL | Status: DC

## 2010-11-16 NOTE — Patient Instructions (Signed)
Try CPAP increase to 8. If you don't like it, please call so we can reverse the change or do something different.   Nasonex refilled

## 2010-11-16 NOTE — Progress Notes (Signed)
  Subjective:    Patient ID: [name], male    DOB: [birth year], 65 y.o.   MRN: [id number]  HPI 11/16/10-65 yoM with OSA and asthma, complicated by atrial fib/ pacemaker and hx CVA.  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 Since then he got through the winter well.  He denies dyspnea. A few wheeze-like sensations do not respond to rescue inhaler. Admits some nasal congestion. He used his Nasonex for a few days. CPAP continues all night every night at 7/ Advanced. He has been tempted to try a bit higher.    Review of Systems Constitutional:   No weight loss, night sweats,  Fevers, chills, fatigue, lassitude. HEENT:   No headaches,  Difficulty swallowing,  Tooth/dental problems,  Sore throat,                No sneezing, itching, ear ache,  CV:  No chest pain,  Orthopnea, PND, swelling in lower extremities, anasarca, dizziness, palpitations  GI  No heartburn, indigestion, abdominal pain, nausea, vomiting, diarrhea, change in bowel habits, loss of appetite  Resp: No shortness of breath with exertion or at rest.  No excess mucus, no productive cough,  No non-productive cough,  No coughing up of blood.  No change in color of mucus.   Skin: no rash or lesions.  GU: no dysuria, change in color of urine, no urgency or frequency.  No flank pain.  MS:  No joint pain or swelling.  No decreased range of motion.  No back pain.  Psych:  No change in mood or affect. No depression or anxiety.  No memory loss.      Objective:   Physical Exam General- Alert, Oriented, Affect-appropriate, Distress- none acute  Skin- rash-none, lesions- none, excoriation- none  Lymphadenopathy- none  Head- atraumatic  Eyes- Gross vision intact, PERRLA, conjunctivae clear, secretions  Ears- Hearing, canals, Tm L , R - normal  Nose- External deviation to right,   No major Septal dev. No-  mucus, polyps, erosion, perforation   Throat- Mallampati III , mucosa clear , drainage- none,  tonsils- atrophic  Neck- flexible , trachea midline, no stridor , thyroid nl, carotid no bruit  Chest - symmetrical excursion , unlabored     Heart/CV- RRR , no murmur , no gallop  , no rub, nl s1 s2                     - JVD- none , edema- none, stasis changes- none, varices- none     Lung- clear to P&A, wheeze- none, cough- none , dullness-none, rub- none     Chest wall-  Abd- tender-no, distended-no, bowel sounds-present, HSM- no  Br/ Gen/ Rectal- Not done, not indicated  Extrem- cyanosis- none, clubbing, none, atrophy- none, strength- nl  Neuro- grossly intact to observation        Assessment & Plan:

## 2010-11-16 NOTE — Progress Notes (Signed)
History of Present Illness: This is a 65 year old male with a history of adenomatous colon polyps and Barrett's esophagus. Colon polyps were initially diagnosed in 2000. He is due for Barrett's surveillance. He would like to proceed with colonoscopy at the same time given that he will need to hold anti-coagulation. His reflux symptoms are under excellent control on daily omeprazole. He has stable obstructive sleep apnea, hyperlipidemia and atrial fibrillation. He is maintained on Pradaxa for management of atrial fibrillation. He states he changed from Coumadin about one year ago. He denies dysphasia, weight loss, nausea, vomiting, abdominal pain, chest pain, change in bowel habits, change in stool caliber, melena, hematochezia. He has stable coronary artery disease hyperlipidemia he  Past Medical History  Diagnosis Date  . Stroke 2008  . Atrial fibrillation   . OSA (obstructive sleep apnea)   . Allergic rhinitis   . Hyperlipidemia   . Asthma   . GERD (gastroesophageal reflux disease)   . Barrett's esophagus 03/1993  . RLS (restless legs syndrome)   . Tubular adenoma polyp of rectum 07/1998  . Hiatal hernia   . Hemorrhoids   . AVM (arteriovenous malformation)    Past Surgical History  Procedure Date  . Cholecystectomy open   . Knee arthroscopy   . Tonsillectomy   . Pacemaker insertion     St. Jude Dr. Dannielle Karvonen    reports that he has never smoked. He has never used smokeless tobacco. He reports that he drinks alcohol. He reports that he does not use illicit drugs. family history includes Heart attack in his father; Sleep apnea in his brother; and Stroke in his mother. Allergies  Allergen Reactions  . Niacin   . Statins     Liver damage   Outpatient Encounter Prescriptions as of 11/16/2010  Medication Sig Dispense Refill  . cholecalciferol (VITAMIN D) 1000 UNITS tablet Take 1,000 Units by mouth daily.        . colesevelam (WELCHOL) 625 MG tablet Take 1,875 mg by mouth 2 (two) times daily  with a meal.        . dabigatran (PRADAXA) 150 MG CAPS Take 150 mg by mouth every 12 (twelve) hours.        Marland Kitchen diltiazem (CARDIZEM LA) 180 MG 24 hr tablet Take 180 mg by mouth 2 (two) times daily.        Marland Kitchen ezetimibe (ZETIA) 10 MG tablet Take 10 mg by mouth daily.        . fenofibrate 160 MG tablet Take 160 mg by mouth daily.        . flecainide (TAMBOCOR) 100 MG tablet Take 100 mg by mouth 2 (two) times daily.        Marland Kitchen glucosamine-chondroitin 500-400 MG tablet Take 1 tablet by mouth 2 (two) times daily.        Marland Kitchen losartan (COZAAR) 50 MG tablet Take 50 mg by mouth daily.        . metoprolol tartrate (LOPRESSOR) 25 MG tablet Take 25 mg by mouth 2 (two) times daily.        . mometasone (NASONEX) 50 MCG/ACT nasal spray 2 sprays by Nasal route daily.  17 g  prn  . Multiple Vitamin (MULTIVITAMIN) capsule Take 1 capsule by mouth daily.        . Omega-3 Fatty Acids (FISH OIL) 1000 MG CAPS Take 1 capsule by mouth 2 (two) times daily.        Marland Kitchen omeprazole (PRILOSEC) 40 MG capsule Take 40 mg by mouth daily.        Marland Kitchen  rOPINIRole (REQUIP) 2 MG tablet Take 2 mg by mouth at bedtime.        . Testosterone (ANDROGEL) 25 MG/2.5GM GEL Use as directed       . peg 3350 powder (MOVIPREP) 100 G SOLR Take 1 kit (100 g total) by mouth once.  1 kit  0  . DISCONTD: mometasone (NASONEX) 50 MCG/ACT nasal spray 2 sprays by Nasal route daily.           Review of Systems: Pertinent positive and negative review of systems were noted in the above HPI section. All other review of systems were otherwise negative.  Physical Exam: General: Well developed , well nourished, no acute distress Head: Normocephalic and atraumatic Eyes:  sclerae anicteric, EOMI Ears: Normal auditory acuity Mouth: No deformity or lesions Neck: Supple, no masses or thyromegaly Lungs: Clear throughout to auscultation,  pacemaker Heart: Regular rate and rhythm; no murmurs, rubs or bruits Abdomen: Soft, non tender and non distended. No masses,  hepatosplenomegaly or hernias noted. Normal Bowel sounds Rectal: deferred to colonoscopy Musculoskeletal: Symmetrical with no gross deformities  Skin: No lesions on visible extremities Pulses:  Normal pulses noted Extremities: No clubbing, cyanosis, edema or deformities noted Neurological: Alert oriented x 4, grossly nonfocal Cervical Nodes:  No significant cervical adenopathy Inguinal Nodes: No significant inguinal adenopathy Psychological:  Alert and cooperative. Normal mood and affect  Assessment and Recommendations:  1. Barrett's esophagus. Continue standard antireflux measures and omeprazole daily. Schedule surveillance endoscopy. The risks, benefits, and alternatives to endoscopy with possible biopsy and possible dilation were discussed with the patient and they consent to proceed.   2. Personal history of adenomatous colon polyps. Initial diagnosis in 2000. Schedule surveillance colonoscopy. The risks, benefits, and alternatives to colonoscopy with possible biopsy and possible polypectomy were discussed with the patient and they consent to proceed. Initially he was scheduled for a five-year surveillance colonoscopy however given his anticoagulation needs he would be safer and more convenient for the patient to do his colonoscopy and endoscopy at the same time.  3. Atrial fibrillation maintained on current accident. The risks benefits and alternatives to a 3 to 5 day hold of Pradaxa were discussed with the patient and he agrees to proceed. Will obtain clearance from his cardiologist and assess whether he needs to be placed on Lovenox or if he can simply hold Pradaxa.

## 2010-11-16 NOTE — Patient Instructions (Addendum)
You have been scheduled for a Colonoscopy/ Upper Endoscopy.  Pick up your prep from your pharmacy.  We will contact you regarding your Pradaxa clearance after we receive a answer from your Cardiologist.  cc: Sherryl Manges, MD       Lesle Chris, MD

## 2010-11-16 NOTE — Assessment & Plan Note (Signed)
Overall he is doing very well with good compliance. We are going to let him try a small pressure increase to 8 cwp/ Advanced.

## 2010-11-17 ENCOUNTER — Encounter: Payer: Self-pay | Admitting: Gastroenterology

## 2010-11-20 ENCOUNTER — Encounter: Payer: Self-pay | Admitting: Internal Medicine

## 2010-11-20 NOTE — Assessment & Plan Note (Signed)
Very mild intermittent asthma. Scuba diver.

## 2010-11-22 NOTE — Op Note (Signed)
NAMEFABIANO, GINLEY                 ACCOUNT NO.:  [id number]   MEDICAL RECORD NO.:  [id number]          PATIENT TYPE:  OIB   LOCATION:  2014                         FACILITY:  [facility]   PHYSICIAN:  Duke Salvia, MD, FACCDATE OF BIRTH:  January 28, 1946   DATE OF PROCEDURE:  11/15/2007  DATE OF DISCHARGE:  11/16/2007                               OPERATIVE REPORT   PREOPERATIVE DIAGNOSIS:  Tachy-brady syndrome.   POSTOPERATIVE DIAGNOSIS:  Dual-chamber pacemaker implantation.   PROCEDURE:  After obtaining the informed consent, the patient was  brought to electrophysiology laboratory and placed on the fluoroscopic  table in a supine position.  After routine prep and drape of the left  upper chest, lidocaine was infiltrated in prepectoral subclavicular  region.  The incision was made and carried down to the layer of the  prepectoral fascia, and a pocket was formed similarly.  Hemostasis was  obtained.   Thereafter, attention was turned to gain access to the extrathoracic  left subclavian vein, which was accomplished without difficulty, without  the aspiration or puncturing of the artery.  Two separate venopunctures  were accomplished, presumably guidewires were placed and retained and  sequentially 7-French sheaths were placed, which were passed through [facility].  Jude 1688TC, active fixation ventricular lead, serial # [id number] and a  1688TC 62 cm, active fixation atrial lead serial #[id number].  The  ventricular lead was marked with a tie.  The leads were manipulated to  the right ventricle where the bipolar R-wave was 13.  The pace impedance  was 753 with threshold 1.2 volts at 0.5 milliseconds.  Current threshold  is 1.8 mV and there is no diaphragmatic pacing at 10 volts and the  current of injury was brisk.   Bipolar photo wave is 1.9 millivolts with pace impedance of 698.  Again,  the current threshold is adequate.  These leads were secured in the  prepectoral fascia and the leads were then  attached to a St. Jude Zephyr  pulse generator model [id number], serial [id number].  The pocket was copiously  irrigated with antibiotic-containing saline solution.  Hemostasis was  reassured.  The lead and the pulse generator were placed in the pocket  and secured in prepectoral fascia.  The wound was then closed in 3  layers in the normal fashion.  The wound was washed, dried and Benzoin  and Steri-Strip dressing was applied.  Needle counts, sponge counts and  instrument counts were correct at the end of the procedure according to  the staff.  The patient tolerated the procedure without apparent  complications.      Duke Salvia, MD, [facility]  Electronically Signed     SCK/MEDQ  D:  12/18/2007  T:  12/19/2007  Job:  [id number]

## 2010-11-22 NOTE — Discharge Summary (Signed)
NAMEBREVEN, GUIDROZ                 ACCOUNT NO.:  [id number]   MEDICAL RECORD NO.:  [id number]          PATIENT TYPE:  INP   LOCATION:  3738                         FACILITY:  [facility]   PHYSICIAN:  Pramod P. Pearlean Brownie, MD    DATE OF BIRTH:  02/18/1946   DATE OF ADMISSION:  12/31/2006  DATE OF DISCHARGE:  01/05/2007                               DISCHARGE SUMMARY   ADMISSION DIAGNOSIS:  Stroke.   DISCHARGE DIAGNOSES:  1. 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  2. Paroxysmal atrial fibrillation.  3. 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  4. Hyperlipidemia.  5. Ischemic heart disease.   HOSPITAL COURSE:  [name] is a 65 year old Caucasian male who  developed sudden onset of visual disturbance in the left hemisphere and  dizziness at home.  He presented to [facility] emergency room beyond  time window for thrombolysis.  He was seen by Dr. Anne Hahn and Dr.  Thad Ranger and was found to have a dense left homonymous hemianopsia on  exam.  He had some residual right eye vision loss from his old optic  neuropathy.  The patient was admitted to the stroke unit for further  evaluation.  Initially, on admission, he was in sinus rhythm.  However,  subsequently, he was found to go in and out of atrial fibrillation while  undergoing the transesophageal echocardiogram.  His heart rate also  varied from the low 40s to the 100s.  He was seen by cardiology on  consultation.  In the past, he had low-risk atrial fibrillation and  hence had been on aspirin, but now since he clearly had had strokes and  went in and out of atrial fibrillation, he was felt to be high risk and  switched to anticoagulation with IV heparin bridging to Coumadin.  His  initial CT scan did not show an acute stroke.  However, subsequently MRI  scan of the brain was obtained which showed a stroke in the right  occipital lobe as well as older subacute infarct in the left parietal  region as well.  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  A 2-D echo showed normal ejection fraction.  Transesophageal echocardiogram showed a possible small patent foramen  ovale but without right-to-left shunt.  No intra-atrial clot was noted.  The patient's total cholesterol was elevated at 231, HDL was low at 36,  triglycerides were elevated at 430.  Hemoglobin A1c was normal at 5.7.  Homocystine at 11.7.  Carotid ultrasound showed no significant stenosis.  The patient was started on statin for his elevated cholesterol as well  as Tricor for elevated triglycerides.  Cardiology started him on  Cardizem for his heart rate and Metoprolol.  On the day of discharge,  the patient was stable and was advised to follow-up in Coumadin Clinic  at [facility] for adjusting his dosage of Coumadin and  with Dr. Diona Browner  his cardiologist as needed and with Dr. Jacki Cones his neurologist in  2 months.   DISCHARGE MEDICATIONS:  1. Coumadin 7.5 mg a day.  2. Cardizem 360 mg a day.  3. Metoprolol 50 b.i.d.  4. Mobic 15 mg a day.  5. Pulmicort 2 grams twice a day.  6. Requip 2 mg h.s.  7. Gemfibrozil 600 b.i.d.  8. Zetia 10 mg a day           ______________________________  Sunny Schlein. Pearlean Brownie, MD     PPS/MEDQ  D:  01/30/2007  T:  01/30/2007  Job:  [id number]

## 2010-11-22 NOTE — Letter (Signed)
November 06, 2007    Jonelle Sidle, M.D.  [address]  [place], [place] [address]   RE:  DESHAY, BLUMENFELD  MRN:  [id number]  /  DOB:  1946-01-23   Dear [name]:   It was a pleasure seeing Mcihael Hinderman in follow-up for his atrial  fibrillation.   As you know, I had seen him at your request last year, and we had put  him on flecainide for his very symptomatic paroxysmal atrial  fibrillation.   He saw you last week, and you had noted that he was in atrial flutter.  Interestingly, he was also unaware of this rhythm.  You gave him an  event recorder to see what was happening with his rhythm, and it turned  out that there were two notable findings.  The first was that he was  having recurrent paroxysms of atrial fibrillation as you had suspected.  The second was that he has significant post termination pauses, one  recorder of almost 5 seconds which was associated with symptoms of  dizziness and lightheadedness.  Moreover, he has had dizzy spells like  this for the last couple of months, some more severe than others, some  presyncopal, others just mildly lightheaded.   His thromboembolic risk factors are notable for prior stroke and  hypertension.   He has normal left ventricular function with no evidence of ischemia,  and he has been on flecainide with asymptomatic recurrences of atrial  flutter and atrial fibrillation.   His medications currently include:  1. Coumadin.  2. Zetia.  3. Atacand.  4. Flecainide.  5. Metoprolol.  6. Requip.   On examination today, his blood pressure was 150/80.  His pulse was 60.  His weight was 201 which is stable.  His weight the other day when he  saw you was 118/76.  His neck veins were flat.  His carotids are brisk and full bilaterally  without bruits.  The back without kyphosis or scoliosis.  The lungs were clear.  HEART:  Sounds were regular without murmurs or gallops.  ABDOMEN:  Soft with active bowel sounds.  Femoral pulses were 2+.   Distal pulses were intact.  There was no  clubbing, cyanosis or edema.  NEUROLOGICAL EXAM:  Was grossly normal.   Electrocardiogram from your office that I would concur demonstrated  probably typical atrial flutter.  Unfortunately, the event recorder  strips that you ranged demonstrate mostly atrial fibrillation and a post  termination pause of more than 4.5 seconds correlated with his episode  of dizziness.   IMPRESSION:  1. Paroxysmal atrial fibrillation and atrial flutter, now largely      without associated symptoms.  2. Post termination pauses associated with lightheadedness and      dizziness.  3. Recurrence of #2 despite discontinuation of his Cardizem.  4. Thromboembolic risk factors as noted above.   Sam, Mr. Gahan has recurrent paroxysms of atrial fibrillation and  recurrent episodes of dizziness which correlate with post-termination  pausing.  I think there are a couple of issues.  The first unfortunately  is that his fibrillation is much more frequent than the flutter and so  undertaking a flutter ablation is not likely going to do him much good.  The post-termination pause that we saw I think also comes after an  atrial fibrillation episode.  Further, the fact that he has had episodes  despite his Cardizem being discontinued seems to be fairly pessimistic  that he will end up having  no post-termination pauses, and I do not know  that given the significance of the pause and the severity of the pause  whether changing his antiarrhythmic drugs with the hopes that we will  keep from having atrial fibrillation will be sufficient to keep him from  having pausing.  Your note also raises the question about atrial  fibrillation ablation, and as always, I appreciate your thoughtfulness  as it relates to your patient, and I told us to Mr. Grayson today.  The  concern that I would have with atrial fibrillation ablation as a primary  therapy for the aforementioned issues is that:  (a)  it will take a while  to happen, and (b) that there is a significant likelihood of recurrence,  and given the severity of his symptoms and presyncopal nature of  symptoms, I am concerned that we will leave him potentially unprotected  for a serious post-termination pause.   Given the above, I have elected then to:  1. Discontinue his metoprolol and put him on pindolol 2.5 b.i.d.  2. Tentatively schedule him for pacer in 2-3 weeks to see how it is      that he fares on the      change in medication.  3. We will talk to you about issues of alternative antiarrhythmic      drugs and catheter ablation.   Thanks very much for asking [place] to see him.    Sincerely,      Duke Salvia, MD, [facility]  Electronically Signed    SCK/MedQ  DD: 11/06/2007  DT: 11/06/2007  Job #: [id number]

## 2010-11-22 NOTE — Assessment & Plan Note (Signed)
[facility]                            CARDIOLOGY OFFICE NOTE   BEHR, CISLO                        MRN:          [id number]  DATE:01/08/2007                            DOB:          [birth year]    REASON FOR VISIT:  Follow up atrial fibrillation and hospital stay.   HISTORY OF PRESENT ILLNESS:  I last saw Mr. Lindamood back in May.  He has a  history of paroxysmal atrial fibrillation that was previously managed  with aspirin, Cardizem CD and metoprolol with a CHADS score of 0.  He  was admitted to the hospital recently on the 23rd of June with a left-  sided visual field cut and was diagnosed with a stroke by the neurology  service.  I do not have a discharge summary as yet for full review, but  he underwent a full evaluation, including a transesophageal  echocardiogram, the report of which demonstrated normal left ventricular  systolic function with no obvious left atrial appendage thrombus,  lipomatous hypertrophy of the interatrial septum,  and mild atheroma of  the descending aorta.  He was noted interestingly around the time of his  transesophageal electrocardiogram to manifest atrial fibrillation with  rapid ventricular response and also symptoms concerning for possible  obstructive sleep apnea during his sedation.  He is now on Coumadin  instead of aspirin, which is certainly appropriate and his other rate  control medicines are detailed below.  He was discharged on short acting  Cardizem and is taking this 4 times a day.  He has scheduled followup to  establish in our Coumadin Clinic.  He has not yet returned to driving  and has followup with Dr. Pearlean Brownie and ophthalmology as well.  He is not  having any bleeding problems and is tolerating Coumadin so far.  He is  in sinus rhythm today on examination.   ALLERGIES:  NIASPAN and LIPITOR.   PRESENT MEDICATIONS:  Include:  1. Metoprolol 25 mg p.o. b.i.d.  2. Zetia 10 mg p.o. daily.  3. Lopid  600 mg p.o. b.i.d.  4. Requip 2 mg p.o. daily.  5. Prilosec OTC as directed.  6. Omega-3, fish oil supplements.  7. Cardizem 60 mg p.o. q.i.d.  8. Multivitamin.  9. Iron supplements.  10.CoQ10.   REVIEW OF SYSTEMS:  As discussed in history of present illness.  Otherwise, negative.   EXAMINATION:  Blood pressure  140/72, heart rate is 60, weight is 195  pounds.  The patient is comfortable and in no acute distress.  HEENT:  Grossly normal.  NECK:  No elevated jugular pressure, without bruits.  No thyromegaly is  noted.  LUNGS:  Clear without labored breathing at rest.  CARDIAC:  Reveals a regular rate and rhythm. No loud murmur or S3  gallop.  No pericardial rub.  ABDOMEN:  Soft, nontender, normoactive bowel sounds.  EXTREMITIES:  No pitting edema.  Distal pulses 2+.  SKIN:  Warm and dry.  MUSCULOSKELETAL:  No kyphosis is noted.  NEURO/PSYCHIATRIC:  Patient alert and oriented x3. Affect is normal.  He  still reports  a lateral visual field cut, but imparts that his vision  has improved in general.   IMPRESSION/RECOMMENDATION:  1. Paroxysmal atrial fibrillation as outlined above. He will be      continued on Coumadin and established in our Coumadin Clinic.  An      INR will be checked today.  He is taking 7.5 mg of Coumadin daily      at this point and was just started this past week.  I will change      his Cardizem to Cardizem CD 240 mg daily. He will otherwise      continue his present dose of metoprolol.  I will plan to see him      back over the next 3 months.  2. Recent stroke with left visual field cut that has improved,      although not completely resolved.  The patient will have continued      followup with neurology.  3. Recently noted symptoms concerning for obstructive sleep apnea      associated with sedation during transesophageal echocardiogram.  In      light of this and paroxysmal atrial fibrillation, he will be      referred to Dr. Maple Hudson with our Pulmonary  Division to discuss      possible sleep study.     Jonelle Sidle, MD  Electronically Signed    SGM/MedQ  DD: 01/08/2007  DT: 01/08/2007  Job #: [id number]   cc:   Pramod P. Pearlean Brownie, MD

## 2010-11-22 NOTE — Discharge Summary (Signed)
[name], [name]                 ACCOUNT NO.:  [id number]   MEDICAL RECORD NO.:  [id number]          PATIENT TYPE:  OIB   LOCATION:  2014                         FACILITY:  [facility]   PHYSICIAN:  Duke Salvia, MD, FACCDATE OF BIRTH:  [birth year]   DATE OF ADMISSION:  11/15/2007  DATE OF DISCHARGE:                               DISCHARGE SUMMARY   The patient has allergies to LIPITOR and NIASPAN.  Marland Kitchen   FINAL DIAGNOSIS:  Discharging day 1, status post implant of St. Jude  Zephyr dual-chamber pacemaker.   SECONDARY DIAGNOSES:  1. Paroxysmal atrial fibrillation with posttermination pauses,      symptoms are dizziness and lightheadedness.  2. 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  3. Hypertension.  4. Echocardiogram in June 2008, ejection fraction of 55-60%.  5. Negative Myoview study in 2007.  6. Dyslipidemia.  7. Obstructive sleep apnea.  The patient uses CPAP.   PROCEDURE:  1. On Nov 15, 2007, implant of St. Jude Zephyr dual-chamber pacemaker      by Dr. Sherryl Manges.  The patient has had no hematoma in the      postprocedure.  The device will be interrogated.  On postprocedure      day #1, chest x-rays will be taken.  Mobility and incision care      will be discussed with the patient.   The patient's medications at discharge are as follows:  1. New dose flecainide 150 mg twice daily.  2. New medication Cardizem LA 180 mg twice daily, not the generic. He      is to stop taking pindolol.  3. Atacand 4 mg daily.  4. Coumadin 5 mg daily except for 2.5 mg on Monday.  5. Fluvoxamine 50 mg tablets one and one half tablets daily.  6. Omeprazole 40 mg daily.  7. Trilipix once 35 mg daily.  8. Ropinirole 2 mg daily at night.  9. Zetia 10 mg daily.  10.Multivitamin daily.  11.Fish oil caps 1200 mg twice daily.  12.Glucosamine chondroitin twice daily.   He follows up at [facility], [address],  [facility] on Thursday, Nov 27, 2007, to see his Dr. Graciela Husbands on Monday August  10.   BRIEF HISTORY:  [name] is a 65 year old male who was seen by Dr. Graciela Husbands  in consultation from Dr. Nona Dell.  The patient wore an event  recorder.  The patient had recurrent paroxysms of atrial fibrillation,  and he also has a significant posttermination pauses, one of them was 5  seconds.  They were associated with dizziness and lightheadedness.  His  thromboembolic risk factors are notable for prior stroke and  hypertension.  He has normal left ventricular function.  No evidence of  ischemia.  He has been on flecainide.  His recurrences of atrial  fibrillation and atrial flutter are mostly asymptomatic.  The patient  had been on Cardizem, but still have these posttermination pauses.  An  office visit with Dr. Graciela Husbands on November 06, 2007, his metoprolol  was  discontinued.  He was started on pindolol 2.5 mg twice daily and he was  scheduled for pacemaker.  In the future, it is possible alternative  antiarrhythmic drugs or catheter ablation.   HOSPITAL COURSE:  The patient presents electively on Nov 15, 2007, then  he underwent implantation of the St. Jude Zephyr dual-chamber pacemaker  on the same day.  He anticipates discharge on Nov 16, 2007, with the  medications and follow as dictated.  His flecainide has been increased  in an effort to diminish the episodes of paroxysms of atrial  fibrillation and he has been restarted on Cardizem.  In the future, he  may need a different antiarrhythmic and to test for that the patient  would probably benefit from wearing a Holter monitor once again to see  note.  In the future, the patient may need a different antiarrhythmic.  The benefit of having a pacemaker is that we can now interrogate to see  if his episodes of atrial fibrillation hours frequent, hours fast as  they have been before.  He will go home with increase of flecainide from  100 mg to 150 mg  twice daily and he will restart Cardizem LA at 180 mg  twice daily.  The interrogation of the pacemaker will help to determine  whether these medications are being efficient.   Laboratory studies pertinent to this admission were drawn on Nov 14, 2007.  White cells 5.7, hemoglobin 14.8, hematocrit 43.1, and platelets  of 159.  Protime is 31.2 and INR is 3.  The patient was given 2.5 mg of  vitamin K for the procedure scheduled for the next day.  Sodium is 140,  potassium 4.7, chloride 104, carbonate 26, glucose is 69, BUN is 19, and  creatinine 1.3.      Maple Mirza, [place]      Duke Salvia, MD, [facility]  Electronically Signed    GM/MEDQ  D:  11/15/2007  T:  11/16/2007  Job:  [id number]   cc:   Duke Salvia, MD, [facility]  Jonelle Sidle, MD  Clinton D. Maple Hudson, MD, FCCP, FACP

## 2010-11-22 NOTE — Assessment & Plan Note (Signed)
[facility]                             PULMONARY OFFICE NOTE   YAMATO, KOPF                        MRN:          [id number]  DATE:01/14/2007                            DOB:          1946-01-23    PROBLEM:  This is a 65 year old gentleman referred through the courtesy  of Dr. Gala Romney suspecting sleep apnea. Primary care is through [facility]. GI is through Dr. [name].   HISTORY:  The concern of obstructive sleep apnea was raised by Dr.  Gala Romney in late June during transesophageal endoscopy. He had fallen  asleep at the wall and he was being evaluated for vision change with  concern of a cerebral vascular accident. Fortunately, his vision has  improved. He has been told of occasional snoring if he is really tired  or congested in the nose, especially when laying on his back, and he has  been told that he may stop breathing. He says that he is always somewhat  tired and could fall asleep at any time given the opportunity. Usual  bedtime is between 10 and 11pm with very short sleep latency, expecting  to wake 2 to 4 times during the night before finally up between 6 and  6:30am. His work requires a daily drive to [employer].   MEDICATIONS:  1. Metoprolol 25 mg b.i.d.  2. Zetia 10 mg.  3. Lopid 600 mg b.i.d.  4. Requip 2 mg nightly.  5. Prilosec.  6. Fish oil.  7. Cardizem CD 60 mg q.i.d.  8. Coumadin.  9. Vitamins.  10.Iron.   Drug intolerant of NIASPAN.   REVIEW OF SYSTEMS:  Snoring, witnessed apneas, sometimes crawling  sensation in legs before sleep onset, daytime sleepiness if quiet and  sometimes while driving, non-restorative sleep, weight gain 10 to 15  pounds.   PAST HISTORY:  Tonsillectomy, atrial fibrillation, cerebral vascular  accident with transient vision loss (neurologist Dr. Pearlean Brownie), elevated  cholesterol, restless legs syndrome, esophageal reflux, surgery for left  knee and gallbladder.   SOCIAL HISTORY:  He quit smoking in 1979. No caffeine. He is married. 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   FAMILY HISTORY:  Brother with sleep apnea on CPAP, several with heart  disease.   OBJECTIVE:  VITAL SIGNS:  Weight not recorded, blood pressure 142/82,  pulse 52, room air saturation 97%.  GENERAL:  He is alert, somewhat overweight.  HEENT:  Palette spacing 2-3/4, nasal airway is clear, voice quality is  normal.  NECK:  No thyromegaly or strider, no neck vein distension.  CHEST:  Quiet, clear, unlabored breathing.  HEART:  Rhythm is regular to my exam now with no murmur or gallop heard.  EXTREMITIES:  No edema. No tremor.   IMPRESSION:  1. Suspect obstructive sleep apnea and we will need to do a sleep      study.  2. Cerebral vascular accident consistent with an embolism.  3. Atrial fibrillation being treated.  4. Restless legs syndrome on Requip.   PLAN:  Split  protocol sleep study at the [facility] center, scheduling  return after that is completed. I appreciate the chance to see him.     Clinton D. Maple Hudson, MD, [name], FACP  Electronically Signed    CDY/MedQ  DD: 02/02/2007  DT: 02/04/2007  Job #: [id number]   cc:   [facility]

## 2010-11-22 NOTE — Assessment & Plan Note (Signed)
[facility]                            CARDIOLOGY OFFICE NOTE   SNEIJDER, BERNARDS                        MRN:          [id number]  DATE:04/19/2007                            DOB:          03-01-1946    REASON FOR VISIT:  Follow up atrial fibrillation.   HISTORY OF PRESENT ILLNESS:  Mr. Casanova is doing well without any  significant sense of palpitations, chest pain, or breathlessness.  His  medical regimen is unchanged, including flecainide, Cardizem CD, and  metoprolol.  He also continues on Coumadin without any bleeding  problems, and has followup regularly through the [facility].  I  referred him to see Dr. Maple Hudson following our last visit with concerns for  a possible obstructive sleep apnea and the patient is in fact now on  CPAP.  He reports a general improvement in daytime somnolence, and I  hope that this will also positively impact his paroxysmal atrial  fibrillation.  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  Today's electrocardiogram shows sinus bradycardia  at 50 beats per minute with normal intervals, except a borderline  prolonged PR of 206 ms.   ALLERGIES:  NIASPAN, LIPITOR.   PRESENT MEDICATIONS:  1. Flecainide (dose pending following call to the pharmacy - started      by Dr. Graciela Husbands).  2. Metoprolol 25 mg p.o. b.i.d.  3. Requip 2 mg p.o. nightly.  4. Cardizem CD 360 mg p.o. daily.  5. Lopid 600 mg p.o. b.i.d.  6. Zetia 10 mg p.o. daily.  7. Prilosec OTC daily.  8. Omega 3 supplements.  9. Coumadin as directed by the [facility].  10.Multivitamin daily.  11.Atacand.   REVIEW OF SYSTEMS:  As described in the history of present illness.  Otherwise, negative.   EXAMINATION:  Blood pressure is 134/82, heart rate is 50 and regular,  weight is 194 pounds.  The patient is comfortable and in no acute distress.  Examination of the neck reveals no elevated jugular venous pressure or  loud  bruits.  No thyromegaly is noted.  LUNGS:  Clear without labored breathing.  CARDIAC:  Reveals a regular rate and rhythm.  No loud murmur or gallop.  EXTREMITIES:  No pitting edema.   IMPRESSION/RECOMMENDATIONS:  1. Paroxysmal atrial fibrillation, presently in sinus rhythm.  Will      plan to continue flecainide, metoprolol, and Cardizem CD for the      time being.  Coumadin is being followed through our [facility]mad[facility]      clinic.  He is having no bleeding problems and is asymptomatic at      this point with no symptoms of palpitations, chest pain, or      breathlessness.  I will plan to see him back over the next 6      months.  2. Obstructive sleep apnea, now on continuous positive airway      pressure, followed by Dr. Maple Hudson.     Jonelle Sidle, MD  Electronically Signed    SGM/MedQ  DD: 04/19/2007  DT: 04/19/2007  Job #: [id number]   cc:   Rennis Chris. Maple Hudson, MD, FCCP, FACP  Duke Salvia, MD, [facility]

## 2010-11-22 NOTE — H&P (Signed)
[facility] ADMISSION   [name], [name]                        MRN:          [id number]  DATE:11/14/2007                            DOB:          07/18/1945    [name] is seen again.  We saw him last week in consultation.  He was  having atrial fibrillation with rapid rates, sinus bradycardia, and post  termination pauses.  He was having significant symptoms of presyncope  which were attributed to the later.  We switched him to pindolol to see  how he might do.  Unfortunately, he continues to have rapid atrial  fibrillation with rates of 120-130.   We have discussed the issues again of ablation versus __________  drug  therapy and the fact that they are not sufficiently certain to preclude  post termination pausing which is his presumed major symptom trigger  right now.   We have reviewed the potential benefits as well as potential risks of a  pacemaker including but not limited to death, perforation, infection,  and lead dysfunction.  He understands these risks and would like to  proceed.   He is a Technical brewer.  We will plan to use a St. Jude device given it  having been tested to 200 feet per his investigations.   His INR today was 3, and he was given vitamin K.   Other medications include:  1. Atacand 4.  2. Flecainide 100 b.i.d.  3. Requip.  4. Zetia.   PHYSICAL EXAMINATION:  VITAL SIGNS:  His blood pressure was 151/83 with  a pulse of 62.  LUNGS:  Clear.  HEART:  Sounds were regular today.  EXTREMITIES:  Without edema.  SKIN:  Warm and dry.   We will plan to proceed with pacemaker implantation in the morning per  his schedule request.     Duke Salvia, MD, [facility]  Electronically Signed    SCK/MedQ  DD: 11/14/2007  DT: 11/14/2007  Job #: [id number]

## 2010-11-22 NOTE — Assessment & Plan Note (Signed)
[facility]                         ELECTROPHYSIOLOGY OFFICE NOTE   LYRIK, BURESH                        MRN:          [id number]  DATE:03/17/2008                            DOB:          [birth year]    [name] is seen in followup for pacemaker implanted for tachy-brady  syndrome and post-termination pauses.  He has been managed with  combination of flecainide, Cardizem, and pacemaker, and he is feeling  terrific.   He is having no untoward issues related to his pacemaker or his drugs as  best as I can tell.   CURRENT MEDICATIONS:  1. Atacand 40.  2. Flecainide, now at 50.  3. Zetia 10.  4. Coumadin.   PHYSICAL EXAMINATION:  VITAL SIGNS:  His blood pressure is 137/80 with a  pulse of 65, weight is 208, which is up about 8 pounds since April.  LUNGS:  Clear.  HEART:  Sounds were regular.  There was an early systolic murmur.  ABDOMEN:  Soft.  EXTREMITIES:  Without peripheral edema.  NEUROLOGIC:  Grossly normal.  SKIN:  Warm and dry.   Interrogation of his St. Jude pulse generator demonstrates a P-wave of  3.3 with impedance of 553, threshold of 0.75 at 0.5 and the R-wave was 9  with impedance of 600, threshold of 0.75 at 0.5.  There were number of  episodes of atrial mode switches about 1.1% at the time, the longest was  2 hours.  The patient has had no significant associated symptoms.   IMPRESSION:  1. Atrial fibrillation with post-termination pauses.  2. Status post pacemaker and flecainide for the above.   Mr. Whiteford is doing quite well at this time.  We will plan to see him  again in 9 months' time.  We will continue on his current medications.     Duke Salvia, MD, [facility]  Electronically Signed    SCK/MedQ  DD: 03/17/2008  DT: 03/18/2008  Job #: [id number]

## 2010-11-22 NOTE — Letter (Signed)
August 10, 2008    [name]  [address]  [place], [place] [address]   RE:  [name], [name]  MRN:  [id number]  /  DOB:  [birth year]   To whom it may concern:    [name] is under my care for obstructive sleep apnea successfully  treated with CPAP therapy.    Sincerely,      Clinton D. Maple Hudson, MD, [name], FACP  Electronically Signed    CDY/MedQ  DD: 08/10/2008  DT: 08/10/2008  Job #: [id number]
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
## 2010-11-22 NOTE — Assessment & Plan Note (Signed)
 HEALTHCARE                         GASTROENTEROLOGY OFFICE NOTE   [name], [name]                        MRN:          [id number]  DATE:08/28/2007                            DOB:          [birth year]    This is a 65 year old white male, who returns for followup of Barrett's  esophagus and adenomatous colon polyps.  He is due for surveillance  colonoscopy and upper endoscopy.  He was started on Coumadin  anticoagulation for paroxysmal atrial fibrillation.  He appears to have  tolerated Coumadin therapy quite well.  He has no ongoing  gastrointestinal complaints and specifically denies reflux symptoms,  dysphagia, odynophagia, change in bowel habits, change in stool caliber,  melena, hematochezia or weight-loss.   CURRENT MEDICATIONS:  Listed on the chart, updated and reviewed.   MEDICATION ALLERGIES:  NIASPAN, leading to itching.   PHYSICAL EXAM:  Well-developed, no acute distress.  Weight 197 pounds, blood pressure is 100/62, pulse 60 and regular.  HEENT EXAM:  Anicteric sclerae, oropharynx clear.  CHEST:  Clear to auscultation bilaterally.  CARDIAC:  Regular rate and rhythm without murmurs appreciated.  ABDOMEN:  Soft, nontender, nondistended.  Normoactive bowel sounds.  No  palpable organomegaly, masses or hernias.   ASSESSMENT AND PLAN:  1. GERD complicated by Barrett's esophagus.  Continue Prilosec 20 mg      p.o. daily.  Risks, benefits, and alternatives to upper endoscopy      with possible biopsy discussed with the patient and he consents to      proceed.  The procedure will be performed off Coumadin for five      days.  2. Personal history of adenomatous colon polyps.  Risks, benefits, and      alternatives to colonoscopy with possible biopsy and possible      polypectomy, performed off Coumadin anticoagulation for five days,      discussed with the patient.  He consents to proceed.  This will be      scheduled electively at the time  of his upper endoscopy.  We will      obtain clearance to hold Coumadin for five days from Dr. Simona Huh.  Plan will be to restart Coumadin on the day of his      procedures.     Venita Lick. [name], MD, [facility]  Electronically Signed    MTS/MedQ  DD: 08/30/2007  DT: 08/30/2007  Job #: [id number]   cc:   Jonelle Sidle, MD

## 2010-11-22 NOTE — Discharge Summary (Signed)
NAMERAFIQ, BUCKLIN                 ACCOUNT NO.:  [id number]   MEDICAL RECORD NO.:  [id number]          PATIENT TYPE:  OIB   LOCATION:  2014                         FACILITY:  [facility]   PHYSICIAN:  Maple Mirza, PA   DATE OF BIRTH:  1945-09-04   DATE OF ADMISSION:  11/15/2007  DATE OF DISCHARGE:  11/16/2007                               DISCHARGE SUMMARY   ALLERGIES:  He has allergies to LIPITOR and NIASPAN.   Dictation greater than 35 minutes.   FINAL DIAGNOSIS:  Discharged on day #1 status post implant of a St. Jude  Zephyr dual-chamber pacemaker.   SECONDARY DIAGNOSES:  1. Paroxysmal atrial fibrillation, chronic Coumadin, post termination      pauses with symptoms of dizziness and lightheadedness.  2. 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  3. Hypertension.  4. A 2-D echocardiogram of June 2008, ejection fraction of 55% to 60%.  5. Negative Myoview study in 2007.  6. Dyslipidemia.  7. Obstructive sleep apnea.  The patient wears a CPAP.   PROCEDURE:  On Nov 15, 2007, implant of a St. Jude Zephyr dual-chamber  pacemaker, [employer]. 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  Dr. Graciela Husbands is the attending.  The  patient has had no post-procedural complications.  His pocket is without  hematoma, and his chest x-ray will be examined, and he will have his  device interrogated prior to discharge.  Dr. Graciela Husbands has adjusted his  medications this admission.  The patient was asked to keep his incision  dry for the next 7 days, to sponge bathe until Friday, Nov 22, 2007.  He  has been given instructions on mobility of his left arm.   DISCHARGE MEDICATIONS:  1. Flecainide 150 mg twice daily.  This is a new higher dose.  2. Cardizem LA 100 mg twice daily, not the generic medication.  The      patient is to stop pindolol.  3. Atacand 4 mg daily.  4. Coumadin 5 mg daily except for 2.5 mg on  Monday.  5. Fluvoxamine 50 mg tablets one and one half tablets daily.  6. Omeprazole 40 mg daily.  7. Trilipix 135 mg daily.  8. Ropinirole 2 mg daily at night.  9. Zetia 10 mg daily.  10.Multivitamin daily.  11.Fish oil caps 1200 mg.   Dictation ended at this point.      Maple Mirza, PA     GM/MEDQ  D:  11/15/2007  T:  11/16/2007  Job:  [id number]   cc:   Duke Salvia, MD, [facility]  Jonelle Sidle, MD  Clinton D. Maple Hudson, MD, FCCP, FACP

## 2010-11-22 NOTE — Assessment & Plan Note (Signed)
[name]                            CARDIOLOGY OFFICE NOTE   [name], [name]                        MRN:          [id number]  DATE:10/29/2007                            DOB:          [birth year]    PULMONOLOGIST:  Dr. Jetty Duhamel.   ELECTROPHYSIOLOGIST:  Dr. Sherryl Manges.   REASON FOR VISIT:  Routine followup.   HISTORY OF PRESENT ILLNESS:  I saw [name] back in December 2008.  He  has a history of paroxysmal atrial fibrillation that overall has been  fairly well-controlled on flecainide, low-dose beta-blocker, long-acting  Cardizem, and Coumadin as [facility].  He  presents to the office today not complaining of any major sense of  palpitations, although occasionally he does feel that is heart is  irregular.  He has not had the profound fatigue that he had with rapid  atrial fibrillation in the past, however.  His electrocardiogram today  unfortunately shows atrial flutter with variable conduction and a  controlled heart rate in the 80s.  It is not clear to me that he has  ever had atrial flutter in the past based on his tracings.  He last saw  Dr. Graciela Husbands back in August 2008.  He is not reporting any problems with  chest pain and reports compliance with his medical regimen.   ALLERGIES:  NIASPAN.   MEDICATIONS:  1. Atacand.  2. Flecainide 100 mg p.o. b.i.d.  3. Metoprolol 25 mg p.o. b.i.d.  4. Requip 2 mg p.o. nightly.  5. Cardizem CD 260 mg p.o. daily.  6. Zetia 10 mg p.o. daily.  7. Prilosec 20 mg p.o. daily.  8. Omega 3 supplements 1200 mg p.o. daily.  9. Coumadin as directed by the [facility].  10.Glucosamine chondroitin.  11.Trilipix.   REVIEW OF SYSTEMS:  As outlined above.  Otherwise negative.   EXAMINATION:  Blood pressure 118/76, heart rate is in the 80s and  irregular, weight is 200 pounds.  The patient is comfortable and in no acute distress.  HEENT:  Conjunctiva, lids normal.  Pharynx  is clear.  Neck is supple.  No elevated venous pressure.  LUNGS:  Clear without labored breathing.  CARDIAC:  Exam reveals irregularly irregular rhythm no pericardial rub.  No S3 gallop.  ABDOMEN:  Soft, nontender.  EXTREMITIES:  Exhibit no frank pitting edema.  Distal pulses are 2+.  SKIN:  Warm and dry.  Muscular kyphosis noted.  Neuropsychiatric the patient alert x3.  Affect is appropriate.   IMPRESSION/RECOMMENDATIONS:  Atrial flutter with controlled ventricular  response and variable conduction on medications outlined above.  The  patient has a documented history of paroxysmal atrial fibrillation and  has been overall relatively well-controlled in this respect.  The atrial  flutter appears to be new.  We discussed the matter today.  There are  obviously a number of management options available.  Elective  cardioversion would certainly be a possiblity, although he was off of  Coumadin for a period of time to allow him to have an elective  colonoscopy in late February and  we would need to verify subsequent INR  levels and determine if TEE would be necessary.  What we will plan at  this point is to obtain a flecainide level to ensure that he is in a  reasonable range at present dose.  I will also provide an event recorder  to see if we can document whether he is coming in and out of his atrial  arrhythmias or is maintaining flutter.  I will also have him follow up  with Dr. Graciela Husbands with questions of whether he would be a candidate for  ablation of atrial flutter and perhaps, for that matter, even atrial  fibrillation down the road.  He could also be considered for change in  antiarrhythmic therapy.  From a symptom perspective he does not seem to  be particularly limited at this point with present heart rate control.  Further plans to follow.     Jonelle Sidle, MD  Electronically Signed    SGM/MedQ  DD: 10/29/2007  DT: 10/29/2007  Job #: [id number]   cc:   Joni Fears D. Maple Hudson,  MD, FCCP, FACP  Duke Salvia, MD, [facility]

## 2010-11-22 NOTE — Assessment & Plan Note (Signed)
[facility]                             PULMONARY OFFICE NOTE   [name], [name]                        MRN:          [id number]  DATE:02/21/2007                            DOB:          Dec 28, 1945    CARDIOLOGIST:  Bevelyn Buckles. Bensimhon, M.D.   GIJudie Petit T. [name], MD, [facility]   PROBLEM:  1. Obstructive sleep apnea.  2. Cerebrovascular accident consistent with embolism.  3. Atrial fibrillation.  4. Restless legs.   HISTORY:  He returns after sleep study done February 07, 2007 demonstrated  moderate obstructive apnea with an index of 29.7 per hour, worst while  sleeping supine, mild to moderate snoring with oxygen desaturation to  84%.  He did not quality for CPAP by split protocol on that night.  He  showed persistent limb movement arousal at 35.3 per hour, despite his  Requip.  He does not seem to notice limb jerks much at home now, so we  are going to leave that issue, but I have discussed possible use of  clonazepam.  We reviewed again the medical issues of sleep apnea  available treatments and the importance of keeping weight down, as well  as driving safely.   MEDICATION:  1. Cardizem 360 mg.  2. Metoprolol 25 mg.  3. Atacand.  4. Flecainide.  5. Requip 2 mg at h.s.  6. Zetia 10 mg.  7. Prilosec.  8. Coumadin.  9. Vitamins.   OBJECTIVE:  VITAL SIGNS:  Weight 191 pounds, blood pressure 122/72,  pulse 46, room air saturation 98%.  GENERAL:  He was alert, in no distress.  HEENT:  Nasal airway clear.  LUNGS:  Clear.   IMPRESSION:  Obstructive sleep apnea, likely to aggravate his existing  cardiac and cardiovascular problems.   PLAN:  Educational discussion done as above.  We are going to titrate  CPAP for trial and schedule return in 1 month; earlier p.r.n.   ALLERGIES:  Drug intolerant to NIASPAN.     Clinton D. Maple Hudson, MD, [name], FACP  Electronically Signed    CDY/MedQ  DD: 02/23/2007  DT: 02/24/2007  Job #: [id number]   cc:    [facility]

## 2010-11-22 NOTE — Consult Note (Signed)
NAMEOSRIC, KLOPF                 ACCOUNT NO.:  [id number]   MEDICAL RECORD NO.:  [id number]          PATIENT TYPE:  EMS   LOCATION:  MAJO                         FACILITY:  [facility]   PHYSICIAN:  Michael L. Reynolds, M.D.DATE OF BIRTH:  July 18, 1945   DATE OF CONSULTATION:  12/31/2006  DATE OF DISCHARGE:                                 CONSULTATION   CHIEF COMPLAINT:  Visual changes.   HISTORY OF PRESENT ILLNESS:  This is the second [facility]  system admission, the first stroke service admission for this 65-year-  old man with a past medical history, which includes atrial fibrillation,  for which he is not chronically anticoagulated.  The patient was at home  today at about 4:30, and states that he bent over to pick something off  the floor.  When he stood up he noted an acute onset of a gray of his  visual field on the left, along with a slight sensation of dizziness.  He alerted his family and they monitored his symptoms, and his symptoms  did improve somewhat but did not resolve.  They then came to the  emergency department.  A code stroke was not called and I was notified  of the patient's presence at 1900.  At this time, the patient is awake  and alert and said that his visual symptoms are a little bit better.  He  is not having any more dizziness.  He says that he has never had any  symptoms like this before.  He denies any other associated issues such  as focal weakness, numbness, paraesthesia, nausea or vomiting.   PAST MEDICAL HISTORY:  He has had atrial fibrillation for approximately  10 years.  He is not anticoagulated for this and, in fact, tells me that  he has not had any recent symptoms of this of which he is aware.  He  also has a history of hyperlipidemia, but no known history of  hypertension or diabetes.  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  Other  medical problems include restless leg syndrome, Barrett esophagus,  hiatal hernia, colon polyps.  He had a history of knee surgery in 1991.   MEDICATIONS:  1. Plavix 75 mg daily.  2. Requip 2 mg q.h.s.  3. Gemfibrozil 600 mg b.i.d.  4. Zetia 10 mg daily.  5. Cardizem CD 360 mg daily.  6. Metoprolol 50 mg b.i.d.  7. Aspirin 325 mg daily.  8. Mobic 15 mg daily.  9. Advil p.r.n.  10.OTC Prilosec.  11.Multivitamin.  12.Fish oil.  13.Glucosamine chondroitin.  14.Iron.  15.Coenzymes Q-10.   ALLERGIES:  NIASPAN.   FAMILY HISTORY:  For hypertension and hyperlipidemia.   SOCIAL HISTORY:  He lives with his wife.  Aside of his visual symptoms,  he is not disabled in any way.  There is no significant history of  tobacco or alcohol use.   REVIEW OF  SYSTEMS:  NEUROLOGIC:  Chronic eye symptoms as above.  CARDIOVASCULAR:  No recent chest pain or palpitations.  He does state  that his blood pressure was elevated at his last cardiology visit  according to old records.  Otherwise, a full 10-system review of systems  is negative, except as outlined in the HPI and the accompanying nursing  record.   PHYSICAL EXAMINATION:  VITAL SIGNS:  Temperature 98.0.  Blood pressure  146/80.  Pulse 63.  Respirations 16.  O2 saturation 98% on room air.  GENERAL EXAMINATION:  This is a healthy-appearing man, supine in the  hospital bed in no evident distress.  HEENT:  Head:  Cranium is normocephalic and atraumatic.  Oropharynx is  benign.  NECK:  Supple without carotid or supraclavicular bruit.  HEART:  Regular rate and rhythm without murmurs.  CHEST:  Clear to auscultation bilaterally.  ABDOMEN:  Soft.  Normoactive bowel sounds.  EXTREMITIES:  No edema, 2+ pulses.  NEUROLOGIC EXAMINATION:  Mental status:  He is awake and alert.  He is  fully oriented to time, place and person.  Recent and remote memory are  intact.  Attention span, concentration and fund of knowledge  are all  appropriate.  Speech is fluent and not dysarthric.  He has no defects to  confrontation, naming and he can repeat a phrase.  Cranial nerves:  Pupils are equal and reactive, although he does have a right afferent  pupillary defect.  Examination of visual fields reveals a left  homonymous hemianopsia in the macular area.  Extraocular movements are  full without nystagmus.  Face, tongue and palate move normal and  symmetrically.  Motor:  Normal bulk and tone.  Normal strength in the  upper and lower extremities muscles without drift.  Sensation intact to  pinprick and double simultaneous stimulation in the lower extremities.  Coordination:  Finger-to-nose performed accurately.  Reflexes are 2+ and  symmetric.  Toes are downgoing bilaterally.  NIH Stroke Scale score is  2.   LABORATORY REVIEW:  CBC and CMET are pending at this time.  CT of the  head is closely reviewed.  There is no acute findings.  He does have  some old calcification in the right basal ganglia.   IMPRESSION:  Acute left homonymous hemianopsia, most likely due to a  right occipital stroke, most likely cardioembolic in nature.   PLAN:  I will admit to the stroke service.  We will proceed with  intravenous heparin and we will likely start Coumadin per protocol.  He  will need a routine stroke workup including MRI, MRA, carotid and  transcranial Dopplers, echocardiogram and stroke lab.  Stroke service to  follow.      Michael L. Thad Ranger, M.D.  Electronically Signed     MLR/MEDQ  D:  12/31/2006  T:  01/01/2007  Job:  [id number]

## 2010-11-22 NOTE — Assessment & Plan Note (Signed)
[facility]                                 ON-CALL NOTE   Calvin, Bryan                        MRN:          [id number]  DATE:01/27/2007                            DOB:          1946-03-09    PRIMARY CARDIOLOGIST:  Jonelle Sidle, M.D.   SUMMARY OF HISTORY:  [name] was recently discharged from the hospital  after he suffered a stroke.  Residual symptoms have primarily resolved.  Since his discharge, he has continued to have problems with intermittent  atrial fibrillation.  He states that he is not aware of a rapid or slow  heart beat, however, he notices occasional episodes of fatigue.  At that  point, he will check his heart rate and it has varied in-between the 70s  and 90s.  He has not checked his blood pressure.  In the last couple of  days when his heart rate has been irregular and in the 90s, he has taken  an extra 60 mg of p.o. Diltiazem with improvement.  He has not had any  associated chest discomfort, shortness of breath, or syncope.   This morning he noticed his heart rate was in the 80s and instead of  taking his usual Cardizem CD 240, he took his Cardizem CD 360.  Prior to  his admission for his stroke, he was on metoprolol 50 mg b.i.d. and  Cardizem CD 360, but since his discharge he has been on metoprolol 25  twice a day and Cardizem 240 daily.  Prior to his stroke, he states that  his heart rate was usually in the 60s and 70s but now it tends to be  more in the 80s and 90s.  And, he thinks he is having more episodes of  irregularity and fatigue.  He cannot be specific on the amount of  duration.   I asked him to resume his Cardizem CD 360 mg daily.  I also asked him to  begin checking his blood pressure, particularly when he takes  supplemental medications to assure that he does not develop any  hypotension.  I also explained that the medications, metoprolol and  Cardizem, will control the ventricular rate and from a  cardiovascular  standpoint, we feel that a heart rate less than 100 is controlled in  atrial fibrillation.  However, with his more frequent episodes of  fatigue and irregular heart beat consideration may also be given to  increasing his metoprolol back to 50 mg b.i.d. that he was taking prior  to his hospitalization.  Consideration should also be given to an event  monitor to determine to how much atrial fibrillation he is having and if  this is a significant amount, consider antiarrhythmic medication in the  future.  His last PT INR was last Monday, and his INR was 4.3.  He is  due to have an INR checked tomorrow morning.  I also instructed [name]  and his wife that if he continues to have episodes of fatigue and  irregularity  despite increasing his Cardizem to 360 mg daily,  then he should consider  followup with Dr. Diona Browner to consider the above.  He was agreeable with  this plan.      Joellyn Rued, PA-C  Electronically Signed      Madolyn Frieze. Jens Som, MD, [facility]  Electronically Signed   EW/MedQ  DD: 01/27/2007  DT: 01/27/2007  Job #: [id number]

## 2010-11-22 NOTE — Letter (Signed)
February 19, 2007    Jonelle Sidle, MD  [phone]  [place], [place] [address]   RE:  Bryan, Calvin  MRN:  [id number]  /  DOB:  03/09/1946   Dear Sam:   Mr. Lineman was seen today in followup after I saw him a couple of weeks  ago for his atrial fibrillation.  We put him on flecainide after he came  back from [facility].  He has tolerated it quite well without recurrent  atrial fibrillation.   We put him on a treadmill to see if there were any rate related  proarrhythmia.  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  Looking back at  his Holter from 2 years ago, his peak heart rate was 110, and on his  treadmill a year ago, his peak heart rate was 105.  I suspect that he is  going to have progressive signs of node dysfunctional and will come to  pacing.   In the interim, what I have done is I have decreased his metoprolol from  50 to 25 twice daily.   Also, because he was hypertensive at his last visit, I put him on  Atacand 4 mg, and today his blood pressure was 130/90.   It is also interesting on his treadmill, and this may be related to his  chronotropic incompetence, that his blood pressure response was markedly  impaired.   He is supposed to see you again in 2 months.  If there is anything I can  do further, please do not hesitate to contact me.  I have asked him to  let me know if there is anything that comes up in the interim.    Sincerely,      Duke Salvia, MD, [facility]  Electronically Signed    SCK/MedQ  DD: 02/19/2007  DT: 02/20/2007  Job #: [id number]

## 2010-11-22 NOTE — Assessment & Plan Note (Signed)
[facility]                         ELECTROPHYSIOLOGY OFFICE NOTE   MAXTYN, NUZUM                        MRN:          [id number]  DATE:02/07/2007                            DOB:          1946/01/06    [name] comes into the office today as an add-on, frustrated because of  his atrial fibrillation.   His atrial fibrillation history dates back a number of years, ten or  more, and has had very few symptoms in the intervening years until about  two months ago.  His initial symptoms were controlled with calcium  blockers.  He presented with an ear infection in February of 2007, at  which point a beta blocker was added.  Evaluation of his heart at that  time demonstrated minimal left atrial enlargement, normal left  ventricular function.  His CHAT score was 0 and he was maintained on  aspirin.   In June of 2008, he had a stroke.  This has affected his left visual  field.   At that time, he was begun on Coumadin.  Since that time, he has felt  that he has been in atrial fibrillation most of the time.  His wife is  medically related and she was aware that, when his heart rate was slow,  it seemed to be regular, suggesting that some of his symptoms may be  related to varying rates in his atrial fibrillation.  Other symptoms may  be related to intermittent atrial fibrillation.   His thromboembolic risk factors are now notable for his stroke, as well  as now evidence of hypertension, both systolic and diastolic.   He comes in today, asking about catheter ablation for atrial  fibrillation.   CURRENT MEDICATIONS INCLUDE:  1. Coumadin.  2. Cardizem 360.  3. Metoprolol 50 b.i.d.  4. Pulmicort.  5. Requip.  6. __________  7. Zetia.   PHYSICAL EXAM:  On examination today, his blood pressure was normal, but  it was 142/82 when he saw pulmonary three weeks ago.  It was 119 with a  diastolic today of 98.  When he saw Dr. Diona Browner in early July, it  was  140 systolic and in March it was 146 systolic.  His lungs were clear today.  Heart sounds were irregular with an early systolic murmur.  ABDOMEN:  Soft.  EXTREMITIES:  Without edema.   Electrocardiogram, dated today, demonstrated atrial fibrillation with  modestly rapid ventricular response.   IMPRESSION:  1. Atrial fibrillation, paroxysmal.  2. Thromboembolic risk factors, notable for:      a.     Prior stroke.      b.     Hypertension.  3. No known ischemic heart disease with a Myoview, March 2007,      demonstrating no scarring and normal left ventricular function.   Mr. Mahany has atrial fibrillation that is paroxysmal.  It is quite  symptomatic.  We discussed treatment options, including rate, versus  rhythm, control and the potential for proarrhythmia with rhythm control  and the issues of catheter ablation with risks and benefits.  At this  point,  he would like to pursue antiarrhythmic drug therapy, although he  would like to wait until after he gets back from [facility], the trip of  which is scheduled over the next week.   Today we will plan to:   1. Begin him on Atacand 4 mg a day for blood pressure control.  2. Begin him on flecainide 100 mg twice daily when he returns.  3. We will plan to undertake stress testing the week of August 11.      Hopefully he will have reverted to sinus rhythm by then and, if      not, we will plan to undertake DC cardioversion.     Duke Salvia, MD, [facility]  Electronically Signed    SCK/MedQ  DD: 02/07/2007  DT: 02/08/2007  Job #: [id number]

## 2010-11-22 NOTE — Assessment & Plan Note (Signed)
[facility]                               LIPID CLINIC NOTE   DALIN, CALDERA                        MRN:          [id number]  DATE:07/29/2007                            DOB:          [birth year]    [name] is seen in lipid clinic for further evaluation, medication  titration associated with his hyperlipidemia and hypertriglyceridemia.  [name] is doing well at this time.  His labs were obtained at the end  of last year.  I spoke with him before the holidays.  At his next visit,  anticoagulation clinic, I asked him to switch him over to fenofibrate so  that we could have more opportunity to up-titrate his other  hypercholesterolemia therapies.  Patient has been doing well overall.  He has had no muscle aches, pain, weakness, fatigue.  He has been  compliant with his medicine.  Based on our previous discussions over the  phone, today, I have given the patient a prescription for fenofibric  acid 135-mg capsules, which is now Trilipix brand per [employer].  I  have given him samples and a prescription, and a discount card.  He will  check his labs in 2 months.  He will call with questions or problems in  the meantime.  He will not start this until after he stops his  gemfibrozil.      Shelby Dubin, PharmD, BCPS, CPP  Electronically Signed      Rollene Rotunda, MD, [facility]  Electronically Signed   MP/MedQ  DD: 07/29/2007  DT: 07/29/2007  Job #: [id number]   cc:   Jonelle Sidle, MD

## 2010-11-22 NOTE — Assessment & Plan Note (Signed)
[facility]                               LIPID CLINIC NOTE   DRURY, ARDIZZONE                        MRN:          [id number]  DATE:01/24/2007                            DOB:          1945-12-17    [name] is a pleasant gentleman patient referred to the anticoagulation  clinic for management of chronic oral anticoagulation with Warfarin  status post CVA. [name] has participated in underwater diving  activities previously, or prior to his stroke. He now has questions  regarding his ability to continue underwater diving activities after his  stroke and after resolution of his symptoms. At the patient's request, I  contacted the [employer] on their non-emergent number and  spoke with one of the medics at their site in [place]. Based on my  discussion with him, there were two areas that were most concerning for  the patient, and I have discussed these with [name] in a follow up  phone call at length:   At any point in time that a diver is submerging one has to use caution  and not submerge too quickly because acute pressure changes in the  medics terms can cause capillary breakage and subsequent hemorrhage in  the lungs and face. Further, if the patient were to ever be in a  situation of decompression injury he would need to follow a conservative  dive performance activity to lessen the likelihood of decompression  injuries including hemorrhage. The standard recommendation is that upon  improvement and return to normal function after a stroke, that a patient  should not proceed with diving for at least 3-6 months. Three months is  the minimum recommended by the medic. This information will be forwarded  to the patient's chart.      [name], PharmD, BCPS, CPP  Electronically Signed      Calvin Rotunda, MD, [facility]  Electronically Signed   MP/MedQ  DD: 01/24/2007  DT: 01/25/2007  Job #: [id number]

## 2010-11-22 NOTE — Procedures (Signed)
[name], [name]                 ACCOUNT NO.:  [id number]   MEDICAL RECORD NO.:  [id number]          PATIENT TYPE:  OUT   LOCATION:  SLEEP CENTER                 FACILITY:  [facility]   PHYSICIAN:  Clinton D. Maple Hudson, MD, FCCP, FACPDATE OF BIRTH:  Jul 19, 1945   DATE OF STUDY:  02/07/2007                            NOCTURNAL POLYSOMNOGRAM   REFERRING PHYSICIAN:  Clinton D. Young, MD, FCCP, FACP   INDICATIONS FOR PROCEDURE:  Hypersomnia with sleep apnea.   RESULTS:  Epward sleepiness score 6/24, height 5 feet 10 inches, weight  187 pounds.   MEDICATIONS:  Home medications are listed and reviewed.   SLEEP ARCHITECTURE:  Total sleep time 345 minutes with sleep efficiency  90%. Stage 1 was 16%; Stage 2, 65%; Stage 3, 14%; REM 5% of total sleep  time. Sleep latency 5 minutes, REM latency 340 minutes, awake after  sleep onset 32 minutes. Arousal index increased at 38.9. Tylenol PM was  taken at 10:40 p.m.   RESPIRATORY DATA:  Apnea/hypopnea index (AHI, RDI) 29.7 obstructive  events per hour, indicating moderate obstructive sleep apnea/hypopnea  syndrome. There were 2 central apnea', 25 obstructive apnea's, 15 mixed  apnea's, and 129 hypopnea's. Events were positional, particularly  associated with supine sleep position. REM AHI 17.1 per hour. Events  clustered in the last half of the night, not permitting CPAP titration  by protocol on this study night.   OXYGEN DATA:  Mild to moderate snoring with oxygen desaturation to a  nadir of 84%. Mean oxygen saturation through the study was 93% on room  air.   CARDIAC DATA:  Atrial fibrillation with heart rate ranging around 73 to  90 beats per minute.   MOVEMENT/PARASOMNIA:  A total of 203 limb jerks were recorded, of which  42 were associated with arousal or awakening for a periodic limb  movement with arousal index of 35.3, which is abnormal. He had taken  Requip prior to arrival at the sleep center.   IMPRESSION/RECOMMENDATIONS:  1.  Moderate obstructive sleep apnea/hypopnea syndrome, AHI 29.7 per      hour with positional events mostly noted while supine. Mild to      moderate snoring with oxygen desaturation to a nadir of 84%.  2. Because events occurred mostly in the last half of the night, there      was not time by protocol for CPAP titration. Consider CPAP      titration or alternative therapies as appropriate.  3. Periodic limb movement with arousal syndrome 35.3 per hour, despite      having taken Requip before arrival at the center.      Clinton D. Maple Hudson, MD, [facility], FACP  Diplomate, Biomedical engineer of Sleep Medicine  Electronically Signed     CDY/MEDQ  D:  02/10/2007 10:33:48  T:  02/10/2007 13:08:37  Job:  [id number]

## 2010-11-22 NOTE — Assessment & Plan Note (Signed)
[facility]                               LIPID CLINIC NOTE   SAJAD, GLANDER                        MRN:          [id number]  DATE:11/20/2007                            DOB:          1946/05/07    SUBJECTIVE:  I reviewed the patient's lipid labs with him in the clinic  today.  They include normal liver function tests.  A total cholesterol  of 283, triglycerides 380, HDL 39.1, LDL direct 168.1.   The patient is currently maintained on Tri-Chlor 160 mg daily.  He will  begin Crestor 10 mg every other day.  Check liver function tests in  three weeks.   He will call with questions or problems in the meantime.      Shelby Dubin, PharmD, BCPS, CPP  Electronically Signed      Rollene Rotunda, MD, [facility]  Electronically Signed   MP/MedQ  DD: 11/20/2007  DT: 11/20/2007  Job #: [id number]

## 2010-11-25 NOTE — Assessment & Plan Note (Signed)
[facility]                         GASTROENTEROLOGY OFFICE NOTE   DELANO, FRATE                        MRN:          [id number]  DATE:06/05/2006                            DOB:          Apr 14, 1946    REFERRING PHYSICIAN:  Jonita Albee, M.D.   REASON FOR REFERRAL:  Hemoccult-positive stool.   Mr. Doxtater is a very nice 65 year old white male whom I have evaluated in  the past for GERD complicated by Barrett's esophagus, adenomatous colon  polyps, and an unexplained iron-deficiency anemia.  Colonoscopy and  upper endoscopy were last performed in March 2006.  A small-bowel  capsule endoscopy was performed in March 2007.  There were multiple tiny  red spots in the small bowel which may be small AVMs.  He was recently  found to have hemoccult-positive stool on digital rectal examination,  and the patient states recent blood work did not show any evidence of  anemia, although I do not have a copy of the CBC. He has no ongoing  gastrointestinal complaints except for GERD which is well controlled on  Prilosec over-the-counter on a daily basis.  He specifically denies any  change in stool caliber, diarrhea, constipation, melena, hematochezia,  weight loss, dysphagia, odynophagia, nausea or vomiting.   CURRENT MEDICATIONS:  Listed in the chart and reviewed.   MEDICATION ALLERGIES:  NIASPAN.   PHYSICAL EXAMINATION:  GENERAL:  No acute distress.  VITAL SIGNS: Height 5 feet 10 inches, weight 200 pounds.  Blood pressure  124/82, pulse 70 and regular.  HEENT:  Sclerae and oropharynx clear.  CHEST: Clear to auscultation bilaterally.  CARDIAC: Regular rate and rhythm without murmurs appreciated.  ABDOMEN: Soft, nontender, nondistended, normoactive bowel sounds.  No  palpable organomegaly, masses, or hernias.  RECTAL: Examination deferred with recent exam by Dr. Perrin Maltese showing no  lesions, normal prostate, and trace hemoccult-positive brown stool.  EXTREMITIES:  Without clubbing, cyanosis, or edema.  NEUROLOGIC:  Alert and oriented x3.  Grossly nonfocal.   ASSESSMENT AND PLAN:  1. Asymptomatic hemoccult-positive stool.  Possible sources include      his known internal hemorrhoids and probable small-bowel      arteriovenous malformations.  Given his recent evaluation, I do not      feel we need to repeat a colonoscopy, endoscopy, or capsule      endoscopy.  He should have CBCs monitored at regular intervals by      Dr. Perrin Maltese.  2. Gastroesophageal reflux disease complicated by Barrett's esophagus.      Continue proton pump inhibitor on a daily basis along with      antireflux measures.  Surveillance endoscopy recommended for March      2013.  3. Personal history of adenomatous colon polyps.  Recall surveillance      colonoscopy recommended for March 2011.     Venita Lick. [name], MD, [facility]  Electronically Signed    MTS/MedQ  DD: 06/08/2006  DT: 06/09/2006  Job #: [id number]   cc:   Jonita Albee, M.D.

## 2010-11-25 NOTE — Assessment & Plan Note (Signed)
[facility]   NAME:  [name], [name]                 ACCOUNT NO.:  [id number]   MEDICAL RECORD NO.:  [id number]      DATE OF BIRTH:  Dec 20, 1945   PHYSICIAN:  Theresia Majors. Tanda Rockers, M.D. VISIT DATE:  05/08/2006                                     OFFICE VISIT   VITAL SIGNS:  Stable.  He is afebrile.  Blood pressure is 117/74.   PURPOSE OF TODAY'S VISIT:  [name] has been followed for a wound on his  left posterior Achilles area.  He has been treated with a combination of  silver colloid dressings, Xeroform, and compressive wraps.  He was last seen  two weeks ago with a clinically adequate response to this treatment and was  scheduled for a followup visit in two weeks with anticipation of complete  healing.  In the interim, he denies drainage, pain, or recurrent trauma.   WOUND EXAM:  Inspection of the wound shows there is a 100%  reepithelialization.   DIAGNOSIS:  Resolved wound.   MANAGEMENT PLAN & GOAL:  We are discharging the patient to the care of his  primary care physician.  We have explained the natural history of wound  maturation and contraction over the next 6 months to 12 months.  The patient  expresses gratitude for having been seen in the clinic.  We are discharging  him as of today.           ______________________________  Theresia Majors. Tanda Rockers, M.D.     [name]  D:  05/08/2006  T:  05/08/2006  Job:  [id number]   cc:   Onalee Hua L. Reed Breech, M.D.

## 2010-11-25 NOTE — Assessment & Plan Note (Signed)
[facility]   NAME:  [name], [name]                 ACCOUNT NO.:  [id number]   MEDICAL RECORD NO.:  [id number]            DATE OF BIRTH:   PHYSICIAN:  Jonelle Sports. Sevier, M.D.  VISIT DATE:  04/24/2006                                     OFFICE VISIT   VITAL SIGNS:  Blood pressure today 130/76.  Pulse 67, regular.  Respirations  17.  Temperature 97.9.   This 65 year old male is being followed for a traumatic wound to the left  Achilles tendon area, which was originally suture, had some breakdown and  resulted in small ulceration.   The recent problem has been one of hypergranulation, which has prevented  advance of the skin margins.   Since last seen, the patient has had no reports of pain and no other  specific difficulty.  No drainage, etc.   EXAMINATION:  On the left posterior heel at the Achilles tendon area is a  small wound measuring 0.9 x 0.9 cm and filled with exuberant granulation,  which in a collar button fashion, extends beyond the skin plain.  There is  no evidence of epithelialization in that area.  No exposed bone or tendon  and no evidence of infection.  No odor.  No eschar.  There is actually no  significant drainage.  Although this does prove to be very __________would  be expected.   The patient still lacks sensation in the area secondary to the injury and  accordingly, we are able to debride the wound without any difficulty  whatsoever.  What we did was sharply debride the hypergranulation, bringing  it back to the level of the skin and then to cauterize it with silver  nitrate.   The wound was then dressed with a Xeroform dressing to avoid sticking and a  2 x 2 to hold significant pressure on the area.   The patient is advised that he may change his bandage to a Band-Aide  effective tomorrow.   Followup visit will be in this clinic in 2 weeks.           ______________________________  Jonelle Sports Cheryll Cockayne, M.D.     RES/MEDQ  D:   04/24/2006  T:  04/24/2006  Job:  [id number]

## 2010-11-25 NOTE — Assessment & Plan Note (Signed)
[facility]                                 ON-CALL NOTE   Calvin, Bryan                          MRN:          [id number]  DATE:10/19/2006                            DOB:          [birth year]    He is a patient of Dr. Remi Deter McDowell's.  I received a call tonight  from Mrs. [name], [name]'s wife, that yesterday after doing some  yard work, [name] felt as though he maybe had a cold sweat but he  denied any chest pain or shortness of breath.  His wife checked his  blood pressure, which was reportedly normal, but that he had some  irregularity to his heartbeat.  She then did a manual pressure and  listened to his heart with her stethoscope and noted a heart rate of 88  beats per minute.  He has continued to have some irregularity to his  heartbeat throughout the day today, but otherwise has been feeling well  without any chest pain, shortness of breath, or significant  palpitations.  She called because she is concerned given his history of  paroxysmal atrial fibrillation.  He is currently on metoprolol 25 mg  b.i.d., along with diltiazem 360 mg daily.  He just took his evening  dose of metoprolol.  He is not on Coumadin as he has a CHADS score of 0.  I discussed his case briefly with Dr. Diona Browner, and we advised that  provided he is asymptomatic, the patient can stay at home and does not  need to come in for evaluation or anticoagulation at this point. We  advised that if he becomes symptomatic at any point, that they should  have a low threshold for coming in to the ED, at which point we would  see him.  If he continues to have irregularity to his heartbeat, I am  going to leave a message in the office to expect him on Monday morning  for an EKG to determine whether or not he is truly in atrial  fibrillation, which is currently rate controlled, or if he is just in  sinus rhythm with some atrial or ventricular ectopy.  She was agreeable  to  this and will let us know if there are any changes.      Nicolasa Ducking, ANP  Electronically Signed      Jonelle Sidle, MD  Electronically Signed   CB/MedQ  DD: 10/19/2006  DT: 10/19/2006  Job #: [id number]

## 2010-11-25 NOTE — H&P (Signed)
[name], [name]                 ACCOUNT NO.:  [id number]   MEDICAL RECORD NO.:  [id number]          PATIENT TYPE:  INP   LOCATION:  1843                         FACILITY:  [facility]   PHYSICIAN:  Jonelle Sidle, M.D. LHCDATE OF BIRTH:  10-10-1945   DATE OF ADMISSION:  08/29/2005  DATE OF DISCHARGE:                                HISTORY & PHYSICAL   PRIMARY CARE PHYSICIAN:  Dr. Perrin Maltese.   REASON FOR ADMISSION:  Rapid atrial fibrillation.   HISTORY OF PRESENT ILLNESS:  [name] is a 65 year old male with an apparent  history of paroxysm atrial fibrillation over the last eight years treated  with full dose aspirin and long-acting Cardizem, hyperlipidemia, restless  leg syndrome, and no clearly documented history of hypertension, type 2  diabetes mellitus, thyroid disease, congestive heart failure, or previous  stroke. He has not had regular cardiology follow-up. He reports having a  recent cold as well as going on a trip scuba diving down in [place] over  the weekend. He began to experience left ear pain with subsequent drainage  and was diagnosed with an ear infection and apparent perforated eardrum  during an emergency department visit at [facility] on Sunday. He  was prescribed Augmentin and Percocet and scheduled for a follow-up visit  with Dr. Perrin Maltese this evening.   He presented to his visit predominantly with complaints of left ear pain and  also with mental status described as being not quite as clear as normal. He  was noted to be in rapid atrial fibrillation with heart rates up into the  140 to 150 range, although did not note any specific palpitations at that  time. He was subsequently transferred to the emergency department for  further assessment.   [name] tells me that he experiences intermittent palpitations sometimes as  frequently as once every two weeks that last up to a few hours at a time. He  takes an additional Cardizem CD with this typically with  success. It sounds  as if he had a remote cardioversion attempt years ago but has been managed  with medical therapy since then and no specific antiarrhythmic therapy.   In the emergency department now, he is on a Cardizem drip, still with heart  rates in the 130s and our plan is for admission to the hospital for better  heart rate control and hopefully spontaneous conversion to sinus rhythm. He  continues to complain of left ear pain and drainage.   ALLERGIES:  LIPITOR reportedly due to liver function tests abnormalities.   CURRENT MEDICATIONS:  1.  Lopid 600 milligrams p.o. b.i.d.  2.  Prilosec OTC as directed.  3.  Cardizem CD 240 milligrams p.o. daily (did not tolerate generic).  4.  Aspirin 325 milligrams p.o. daily.  5.  Requip 2 milligrams p.o. t.i.d.  6.  Zetia 10 milligrams p.o. daily.  7.  Crestor 10 milligrams p.o. daily.   PAST MEDICAL HISTORY:  1.  Paroxysm atrial fibrillation over the last eight years as outlined      above.  2.  Restless leg syndrome.  3.  Hyperlipidemia.  4.  History of Barrett's esophagus and hiatal hernia.  5.  Previously documented colonic polyps.  6.  History of allergic rhinitis.  7.  Reported history of obsessive-compulsive disorder.  8.  Status post left anterior cruciate ligament repair in 1991.  9.  No clearly documented history of coronary artery disease and myocardial      infarction.  10. History of optic neuritis involving the right eye treated by Dr. Casimiro Needle      L. Reynolds back in September of 2006.   SOCIAL HISTORY:  The patient is married. He works as a Runner, broadcasting/film/video. There is no  significant tobacco or alcohol use history.   FAMILY HISTORY:  Significant for hypertension and cardiovascular as well as  hyperlipidemia. No obvious dysrhythmias reported.   REVIEW OF SYSTEMS:  As described in history of present illness. He does  report some subjective chills but no obvious fevers. He and his wife state  that his mental status has been  somewhat fuzzy. He has not had any neck  stiffness or tenderness. He has had no obvious rapid palpitations recently  or syncope. He denies any active chest pain or shortness of breath.   PHYSICAL EXAMINATION:  VITAL SIGNS: Blood pressure is 124/78, heart rate is  in the 130s and atrial fibrillation. The patient is afebrile. Temperature of  98.3 degrees.  GENERAL: This is a normally nourished-appearing male complaining of left ear  pain and also of discomfort due to restless legs.  HEENT: Conjunctivae and lids are normal. There is serosanguineous appearing  drainage from the left ear. Visualization of the tympanic membrane showed  probable perforation towards the superior aspect. Oropharynx is clear.  NECK: Supple without elevated jugular venous pressure. There is no  tenderness to movement, no stiffness. No carotid bruits are noted.  LUNGS: Clear with nonlabored breathing at rest.  CARDIOVASCULAR: An irregularly irregular rhythm without obvious S3 gallop  and soft apical systolic murmur.  ABDOMEN: Soft with normal active bowel sounds.  EXTREMITIES: No significant pitting edema. Peripheral pulses are 2+.  SKIN: No ulcer changes noted.  MUSCULOSKELETAL: No kyphosis noted.  NEUROPSYCHIATRIC: The patient is alert and oriented x3. His affect is  normal.   LABORATORY DATA:  WBC 5.1, hemoglobin 10.1, hematocrit 30.6, platelets  133,000, 83% neutrophils. INR is 1.3. Sodium 133, potassium 4.0, chloride  101, bicarbonate 23, glucose 106, BUN 17, creatinine 1.4, total bilirubin  1.3. AST 93, ALT 74. Troponin I less than 0.05. CK-MB 3.2.   Chest x-ray reports borderline heart size with mild bronchitic changes,  question early infiltrate. A 12-lead electrocardiogram shows rapid atrial  fibrillation at 145 beats per minute with nonspecific ST and T-wave changes.   IMPRESSION:  1.  Rapid atrial fibrillation with apparent eight-year history of paroxysm     atrial fibrillation managed with full  dose aspirin and long-acting      Cardizem: Onset of recent episode is not entirely certain, although it      sounds as if the patient has episodes as frequently as every two weeks      managed with an additional Cardizem dose. This is in the setting of left      ear pain as discussed below.  2.  Left ear infection with apparent tympanic membrane perforation, status      post recent cold and diving trip in [place] over the weekend. He has      been treated with Percocet and Augmentin 875 milligrams p.o. b.i.d.  following an emergency department visit on Sunday.  3.  Restless leg syndrome.  4.  Hyperlipidemia.  5.  History of optic neuritis involving the right eye, treated by Dr.      Thad Ranger in September of 2006.   PLAN:  1.  I reviewed the situation with the patient and his wife. He will be      admitted to telemetry so we can obtain better heart rate control. We      will advance Cardizem and use intravenous beta blockers or perhaps      digoxin as needed. Hopefully, the patient will spontaneously convert      with better heart rate control as he has in the past.  2.  Continue Percocet and Augmentin for left ear infection. I suspect that a      formal ENT evaluation may also be reasonable to make sure that we do not      need to assess for any other issues at this time.  3.  No heparin for the time being given drainage from the left ear with      apparent perforated tympanic membrane to reduce bleeding risks.  4.  Continue other home medications.  5.  Cycle cardiac markers and follow up TSH. We will also repeat CMET in the      morning given creatinine 1.4 and liver function abnormalities.  6.  Further plans to follow.           ______________________________  Jonelle Sidle, M.D. [facility]     SGM/MEDQ  D:  08/30/2005  T:  08/30/2005  Job:  [id number]   cc:   Jonita Albee, M.D.  Fax: [phone]

## 2010-11-25 NOTE — Assessment & Plan Note (Signed)
[facility]   NAME:  [name], [name]                 ACCOUNT NO.:  [id number]   MEDICAL RECORD NO.:  [id number]      DATE OF BIRTH:  January 07, 1946   PHYSICIAN:  Theresia Majors. Tanda Rockers, M.D. VISIT DATE:  04/10/2006                                     OFFICE VISIT   VITAL SIGNS:  Blood pressure is 120/85, respirations 16, pulse rate of 70  and regular, he is afebrile.   PURPOSE OF TODAY'S VISIT:  [name] is a 65 year old man who has an open  wound on his right posterior heel in the area of the Achilles tendon.  He  has been seen several weeks previously and continuously, with instruction to  use antiseptic soap, and he has been applying a Xeroform nonadherent  dressing.  He denies pain or fever.  He does report some serous drainage.   WOUND EXAM:  The wound itself shows hypergranulation tissue, which is clean.  There is mild erythema in the periphery, but there is no drainage, there is  no excessive tenderness.  The range of motion of the ankle is essentially  normal.   WOUND SINCE LAST VISIT:  __________   CHANGE IN INTERVAL MEDICAL HISTORY:  __________   DIAGNOSIS:  Contracting, granulating wound with evidence of  hypergranulation.   TREATMENT:  __________   ANESTHETIC USED:  __________   TISSUE DEBRIDED:  __________   LEVEL:  __________   CHANGE IN MEDS:  __________   COMPRESSION BANDAGE:  __________   OTHER:  __________   MANAGEMENT PLAN & GOAL:  We will continue the Xeroform dressing.  We will  reevaluate the patient in 2 weeks.  If the hypergranulation is persistent,  we will recommend a local infiltration with Xylocaine and a  debridement and cauterization of the hypergranulation tissue.  We have  explained this approach to the patient in terms that he seems to understand,  and he has been given a opportunity to ask questions.  We will reevaluate  him in 2 weeks.           ______________________________  Theresia Majors Tanda Rockers, M.D.     [name]   D:  04/10/2006  T:  04/11/2006  Job:  [id number]   cc:   Leonides Grills, M.D.

## 2010-11-25 NOTE — Consult Note (Signed)
NAMEJARAMIE, [name]                 ACCOUNT NO.:  [id number]   MEDICAL RECORD NO.:  [id number]          PATIENT TYPE:  INP   LOCATION:  2027                         FACILITY:  [facility]   PHYSICIAN:  Iva Boop, M.D. LHCDATE OF BIRTH:  1946-06-09   DATE OF CONSULTATION:  09/01/2005  DATE OF DISCHARGE:  09/02/2005                                   CONSULTATION   CHIEF COMPLAINT/REASON FOR CONSULTATION:  Anemia.   ASSESSMENT:  Microcytic anemia that is probably iron deficiency. He is  Hemoccult negative. He has Barrett's esophagus with a small to medium size  hiatal hernia on endoscopy March 2006 and he also had a colonoscopy at that  time that showed a 3 mm polyp, internal hemorrhoids, and diverticulosis. He  does not have signs of obvious GI bleeding. Mild elevation in liver function  studies, which could be due to Crestor. He has been using a lot of Ibuprofen  lately and it may be due to that as well. This needs no further workup at  this time. It does need to be followed.   RECOMMENDATIONS/PLANS:  I think he will probably end up coming to a capsule  endoscopy to look for cause of iron deficiency anemia. He does eat meat, so  I do not think it nutritional and I doubt it is malabsorption. We will  followup in the next couple of days and arrange outpatient evaluation as  well if needed.   HISTORY OF PRESENT ILLNESS:  This is a 65 year old white man that was  admitted to the hospital with a flare of atrial fibrillation. Lately, he has  had trouble with an ear infection. He punctured his ear drum after scuba  diving. He was given Augmentin and Percocet and was using a lot of Ibuprofen  as well. He was found to be anemic with a hemoglobin of 9.4, then 8.9, with  an MCV of 72. His iron level is 10. There is no saturation, TIBC, or  ferritin yet. He apparently had some expressive aphasia and mild confusion  with his rapid atrial fibrillation. He has no melena, bright red blood per  rectum. He has occasional constipation. This is mainly since admission. He  had a bowel movement after laxatives. He had been on Prilosec OTC as an  outpatient with good control of his reflux symptoms.   ALLERGIES:  LIPITOR (has caused elevated liver function studies).   MEDICATIONS:  Augmentin, Ecotrin, Ciprofloxacin otic suspension, Cardizem,  Zetia, Lopid, heparin drip, Lopressor, Protonix, Requip, and Coumadin.   PAST MEDICAL HISTORY:  1.  Restless legs.  2.  Paroxysmal atrial fibrillation.  3.  Dyslipidemia.  4.  Hiatal hernia with Barrett's esophagus. Colonoscopy study as above.  5.  Allergic rhinitis.  6.  Optic neuritis in September 2006, treated by Dr. Thad Ranger.  7.  Left knee surgery.  8.  Cholecystectomy.  9.  Obsessive compulsive disorder.   SOCIAL HISTORY:  No alcohol or tobacco. Married. Lives in [place]. 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 He does  drink wine occasionally, not much. No tobacco.   FAMILY HISTORY:  Hypertension, history of ulcers. No colon cancer.   REVIEW OF SYSTEMS:  As per HPI. He does eat red meat regularly. He has had  knee surgery. He has the restless leg problem. All other systems are  negative.   PHYSICAL EXAMINATION:  GENERAL:  Pleasant, well developed, overweight, obese  white male.  VITAL SIGNS:  Blood pressure 116/70, pulse 78 and regular, respiratory rate  20, temperature 98.8.  HEENT:  Eyes anicteric. Left ear drum has cotton in place. Mouth free of  lesions.  NECK:  Supple.  CHEST:  Clear.  HART:  S1 and S2. No murmur, rub, or gallop.  ABDOMEN:  Soft, nontender. No organomegaly. No mass. No hepatosplenomegaly.  RECTAL:  Examination is not performed but he has brown heme negative stool,  analyzed today.  EXTREMITIES:  No edema.  NEUROLOGIC:  Alert and oriented times three. Speech clear.   LABORATORY DATA:  Data as  above as far as the hemoglobin. MCV 72, platelets  normal. White blood cell count normal. Coag's normal. BUN 21, creatinine  1.1. Electrolytes:  Sodium 133, potassium 3.8, chloride 102, CO2 24, glucose  100, alkaline phosphatase 99, AST 87, ALT 78, iron 10. Cardiac enzymes  negative. TSH normal.   CT of the head showed mastoid inflammation, no stroke.   I appreciate the opportunity to care for this patient.      Iva Boop, M.D. [facility]  Electronically Signed     CEG/MEDQ  D:  09/01/2005  T:  09/03/2005  Job:  [id number]   cc:   Onalee Hua L. Reed Breech, M.D.  Fax: [phone]   Venita Lick. [name], M.D. [facility]  [address]  [place]  [place] [address]   Jonelle Sidle, M.D. [facility]  [address]  [place]  [place] [address]

## 2010-11-25 NOTE — Assessment & Plan Note (Signed)
[facility]                              CARDIOLOGY OFFICE NOTE   HANK, WALLING                        MRN:          [id number]  DATE:05/24/2006                            DOB:          1946/03/26    PRIMARY CARE PHYSICIAN:  Jonita Albee, M.D.   REASON FOR VISIT:  Followup paroxysmal atrial fibrillation.   HISTORY OF PRESENT ILLNESS:  I saw Mr. Carias back in June.  He continues to  do quite well without any report of palpitations or rapid heart rate.  He  states that he has continued to scuba dive since our last visit without any  difficulties.  He is not manifesting any angina or limiting dyspnea on  exertion.  I had originally scheduled him for an exercise treadmill test  after our last visit.  However, he had to cancel this due to an ankle  injury.  He presented earlier today and underwent an exercise treadmill  test, which is reported separately.  He performed the test on both  metoprolol and Cardizem CD, and therefore, had a submaximal heart rate  response.  However, did achieve 13.4 METS without significant difficulty and  had no ischemic ST segment changes or development of atrial fibrillation.   ALLERGIES:  Prior intolerance to LIPITOR and CRESTOR due to liver function  abnormalities.   PRESENT MEDICATIONS:  1. Cardizem CD 360 mg p.o. daily.  2. Metoprolol 25 mg p.o. b.i.d.  3. Zetia 10 mg p.o. daily.  4. Lopid 600 mg p.o. b.i.d.  5. Aspirin 325 mg p.o. daily.  6. Requip as directed.  7. Prilosec OTC as directed.  8. Omacor 2 tablets p.o. b.i.d.  9. Replete 1 p.o. daily.   REVIEW OF SYSTEMS:  As described in the history of present illness.   EXAMINATION:  Blood pressure today 130/83, heart rate 68.  The patient is comfortable in no acute distress.  HEENT:  Conjunctivae and lids are normal.  Oropharynx is clear.  NECK:  Supple without elevated jugular venous pressure without bruits.  No  thyromegaly was noted.  LUNGS:   Clear without labored breathing.  CARDIAC:  Regular rate and rhythm without murmur, rub, or gallop.  ABDOMEN:  Soft.  Normoactive bowel sounds.  EXTREMITIES:  No significant pitting edema.  SKIN:  Warm and dry.  No cyanosis, clubbing, or edema.  Distal pulses are 2+.   ELECTROCARDIOGRAM:  Sinus rhythm at 68 beats per minute.   IMPRESSION/RECOMMENDATIONS:  1. History of paroxysmal atrial fibrillation, well-controlled on medical      therapy, and at present not on Coumadin.  I would continue with symptom      observation.  Will make no medication adjustments today and I will plan      to see him back over the next 6 months.  2. Submaximal exercise treadmill test on Cardizem CD and metoprolol with      good maximum work load of 13.4 metabolic equivalents.  As far as his      ability to continue with scuba diving, this seems quite reasonable and  in line with the general cardiovascular recommendations for such      activity.     Jonelle Sidle, MD  Electronically Signed    SGM/MedQ  DD: 05/24/2006  DT: 05/24/2006  Job #: [id number]   cc:   Jonita Albee, M.D.

## 2010-11-25 NOTE — Assessment & Plan Note (Signed)
[facility]   NAME:  [name], [name]                 ACCOUNT NO.:  [id number]   MEDICAL RECORD NO.:  [id number]      DATE OF BIRTH:  05-09-1946   PHYSICIAN:  Theresia Majors. Tanda Rockers, M.D. VISIT DATE:  03/27/2006                                     OFFICE VISIT   SUBJECTIVE:  [name] is a 65 year old man who was seen for an ulceration of  his left Achilles tendon area.  He was seen on August 23, and was instructed  to begin antiseptic soap washings.  He reported that he discontinued using  the antiseptic soap because of some bleeding when he took the sock off.  In  the interim, he was seen by Dr. Lestine Box, who placed him on a Xeroform  dressing following the application of silver nitrate.  He returns 3 weeks  post his original visit.  In the interim, he denies fever, pain or swelling.   OBJECTIVE:  His blood pressure is 120/80, respirations are 20, pulse rate  74, and he is afebrile.  Inspection of the posterior wound shows that there is a fully granulated  wound with advancement of epithelium from the edges, with an area of  approximately 1 cm centrally that is 100% granulated.  There is some  desquamation at the periphery, but this did not need debridement.   ASSESSMENT:  Improving wound.   PLAN:  We have instructed the patient to continue the use of the Xeroform,  and we will reevaluate him in 2 weeks, anticipating that the wound should be  closed at that time.           ______________________________  Theresia Majors Tanda Rockers, M.D.     [name]  D:  03/27/2006  T:  03/27/2006  Job:  [id number]

## 2010-11-25 NOTE — Procedures (Signed)
[facility]                                EXERCISE TREADMILL   KHOURY, SIEMON                        MRN:          [id number]  DATE:05/24/2006                            DOB:          May 08, 1946    INDICATIONS:  [name] is a 65 year old male with history of paroxysmal  atrial fibrillation that has been well controlled with medications.  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   RESTING DATA:  Blood pressure 130/83, heart rate 68 in sinus rhythm.  A 12-  lead electrocardiogram shows sinus rhythm at 68 beats per minute.   STRESS DATA:  The patient was exercised on a standard Bruce protocol for 12  minutes achieving a maximum work load of 13.4 mets.  The study was  discontinued due to leg fatigue.  He had no chest pain or marked shortness  of breath with activity.  The peak heart rate was 105 beats per minute (on  metoprolol and Cardizem CD) which was 65% of the maximum predicted heart  rate.  Peak blood pressure was 184/84.  There were no clearly diagnostic ST  segment changes by standard criteria to suggest ischemia.  Occasional  premature atrial complexes and ventricular complexes were noted without any  significant sustained arrhythmias.   CONCLUSIONS:  Submaximal exercise treadmill test without evidence of  ischemia.  The patient did not achieve 85% of the maximum predicted heart  rate while taking both Cardizem CD and metoprolol, although he did achieve a  maximum work load of 13.4 mets and tolerated this fairly well.  He had  occasional premature atrial and ventricular complexes, although, no  sustained arrhythmias.  Specifically, no atrial fibrillation.  Overall, the  study is reassuring.     Jonelle Sidle, MD  Electronically Signed    SGM/MedQ  DD: 05/24/2006  DT: 05/24/2006  Job #: [id number]   cc:   Jonita Albee, M.D.

## 2010-11-25 NOTE — Assessment & Plan Note (Signed)
[facility]   NAME:  [name], [name]                 ACCOUNT NO.:  [id number]   MEDICAL RECORD NO.:  [id number]      DATE OF BIRTH:  17-Aug-1945   PHYSICIAN:  Theresia Majors. Tanda Rockers, M.D. VISIT DATE:  03/01/2006                                     OFFICE VISIT   VITAL SIGNS:  His vital signs are stable. He is afebrile.   PURPOSE OF TODAY'S VISIT:  [name] is a 65 year old man who is seen for a  postoperative wound of the left Achilles area. During his visit three days  ago, he was started on a matrix dressing and was seen in followup to ensure  that it was progressing positively. He denies fever, pain, redness, or  malodor.   WOUND EXAM:  Inspection of the wound from the patient in the prone position  shows that there is a 100% granulated bed which has decreased significantly  since his last visit. There is continued advancement of epithelium.   WOUND SINCE LAST VISIT:   CHANGE IN INTERVAL MEDICAL HISTORY:   DIAGNOSIS:   TREATMENT:   ANESTHETIC USED:   TISSUE DEBRIDED:   LEVEL:   CHANGE IN MEDS:   COMPRESSION BANDAGE:   OTHER:   ASSESSMENT:  Improved wound.   MANAGEMENT PLAN & GOAL:  We have instructed the patient to shower twice a  day with antiseptic soap and to wear a cotton sock. This would should  continue to contract without any difficulty at all. We will reevaluate  patient in two weeks p.r.n. If he has any difficulty, he is to call us. The  patient seems to understand. We will see him in two weeks.           ______________________________  Theresia Majors Tanda Rockers, M.D.     [name]  D:  03/01/2006  T:  03/02/2006  Job:  [id number]

## 2010-11-25 NOTE — Assessment & Plan Note (Signed)
[facility]   NAME:  MANVEER, GOMES                 ACCOUNT NO.:  [id number]   MEDICAL RECORD NO.:  [id number]      DATE OF BIRTH:  [birth year]   PHYSICIAN:  Theresia Majors. Tanda Rockers, M.D. VISIT DATE:  02/26/2006                                     OFFICE VISIT   CONSULTATION:   REASON FOR CONSULTATION:  Mr. Aderman is referred by Dr. Lestine Box for continued  followup of a left foot wound.   IMPRESSION:  Healing postoperative wound for repair of Achilles tendon  laceration.   SUBJECTIVE:  Mr. Stillinger sustained a laceration on December 21, 2005 and was  repaired per Dr. Ashok Norris note.  Since that time he has had some delay in  healing with some moderate drainage initially and then decreasing.  He had  been on two courses of antibiotics without evidence of systemic or spread.  Over the last week he has utilized antiseptic soap and a dry dressing along  with Iodosorb gel.  He denies fever.  He denies pain.  He continues to be  active.  He wears a sandal which does not strike the open wound.   PAST MEDICAL HISTORY:  His past medical history is remarkable for ALLERGY TO  NIACIN.   CURRENT MEDICATIONS:  1. Cardizem 360 mg b.i.d.  2. Metoprolol 20 mg b.i.d.  3. Prilosec one a day.  4. Pulmicort two tabs b.i.d.  5. Aspirin daily.  6. Requip 2 mg q.h.s.  7. Multivitamins daily.  8. Lopid 25 mg b.i.d.  9. Floxamine one half tab daily.  10.Zetia.   PAST SURGICAL HISTORY:  His past surgery has included a cholecystectomy and  the Achilles tendon repair.  He has had episode of atrial fibrillation and a  traumatic rupture of the tympanic membrane.   FAMILY HISTORY:  The family history is positive for vascular disease,  strongly positive for myocardial infarct, negative for diabetes and cancer.   SOCIAL HISTORY:  He is married.  He has three children who live in the local  area.  He is a Armed forces technical officer.   REVIEW OF SYSTEMS:  The review of systems is specifically negative for  angina pectoris, his weight has been stable, he has no shortness of breath,  no bowel or bladder dysfunction.  The remainder of his review of systems is  negative.   PHYSICAL EXAMINATION:  GENERAL:  He is an alert, oriented man in good  contact with reality in no acute distress.  VITAL SIGNS:  His blood pressure is 120/80, pulse rate of 70, respirations  22, and he is afebrile.  HEENT:  Clear.  NECK:  Supple.  Trachea is midline.  Thyroid is nonpalpable.  LUNGS:  Clear.  HEART:  The heart sounds are normal.  ABDOMEN:  Soft.  EXTREMITY:  Exam is normal.  There is trace edema.  There are bounding  bilateral dorsalis pedis pulses.  There is a healthy-appearing 100%  granulated wound on the Achilles area of the left heal.  There is evidence  of advancing epithelium at the periphery.  There are stains consistent with  an iodine preparation use.  The wound measures 1.5 cm in length by 4.2 cm in  width and it is somewhat raised.  The  granulation tissues looks healthy but  it does look partially desiccated.   DISCUSSION:  Mr. Pullman wound appears very healthy and it should heal  without incident.  We would recommend the discontinuation of the Iodosorb  and use a Matrix dressing Promogran with hydrogel.  We placed this dressing  in the wound center.  We will leave it on 3 days.  We will change it at the  end of that time and reassess the wound.  This wound should heal  straightforward without complication.           ______________________________  Theresia Majors Tanda Rockers, M.D.     [name]  D:  02/26/2006  T:  02/26/2006  Job:  [id number]   cc:   Leonides Grills, M.D.

## 2010-11-25 NOTE — Discharge Summary (Signed)
[name], [name]                 ACCOUNT NO.:  [id number]   MEDICAL RECORD NO.:  [id number]          PATIENT TYPE:  INP   LOCATION:  2027                         FACILITY:  [facility]   PHYSICIAN:  Charlton Haws, M.D.     DATE OF BIRTH:  October 26, 1945   DATE OF ADMISSION:  08/30/2005  DATE OF DISCHARGE:  09/02/2005                                 DISCHARGE SUMMARY   PROCEDURES:  CT of the head without contrast.   PRIMARY DIAGNOSES:  1.  Atrial fibrillation with rapid ventricular response.  2.  Hyperlipidemia with a total cholesterol of 128, triglycerides 306, HDL      15, LDL 52, this admission.  3.  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  4.  History of restless leg syndrome.  5.  History of Barrett's esophagus/hiatal hernia/colon polyp.  6.  History of allergic rhinitis.  7.  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  8.  Status post anterior cruciate ligament repair.  9.  History of left ear infection with apparent tympanic membrane      perforation after a diving trip in [place], on Augmentin.  10. History of optic neuritis in the right eye.   HOSPITAL COURSE:  [name] is a 65 year old male with no known history of  coronary artery disease.  He reportedly has a history of paroxysmal atrial  fibrillation by his descriptions and with the remote history of direct-  current cardioversion.  He has been on aspirin and Plavix and has occasional  palpitations.  He went to his family physician for an ear infection and was  noted to have a rapid and irregular heart beat.  He was found to be in  atrial fibrillation with rapid ventricular rate and sent to the emergency  room, where he was admitted by Cardiology.   He had been taking Cardizem 240 mg a day, but this was initially  transitioned to IV.  As his heart rate became better controlled and then he  converted, he was transitioned to  p.o. Cardizem and then Cardizem CD daily.  His vital signs were stable and his heart rate was within normal limits.   [name] had been on Percocet and Augmentin for his left ear infection.  He  had Cipro drops added to his medication regimen and is to complete the  course of Augmentin.  Dr. Ezzard Standing was consulted by phone and is to see him in  followup.   [name] has a reported history of hypertriglyceridemia with triglycerides  at one time greater than 1000.  He has been on Lopid, Zetia and Crestor.  However, his liver function tests were a little abnormal with an SGOT of 87  and an SGPT of 78.  The Crestor is on hold and he is to continue taking his  other medications and follow up with Dr. Diona Browner.   Because of the anemia and iron deficiency, a GI consult was called.   [name] has been seen in  the past by GI and has had an EGD and colonoscopy  in the past.  He was seen by GI, but was heme-negative and it was not felt  that any further inpatient workup was indicated.  Dr. Leone Payor agreed with  discontinuing the Crestor and following his LFTs.  Dr. Leone Payor felt that he  would need capsule endoscopy as an outpatient and should follow up with Dr.  [name] after discharge.   By September 02, 2005, [name] was having no GI complaints.  He was  maintaining sinus rhythm on an increased dose of Cardizem.  He was evaluated  by Dr. Eden Emms and considered stable for discharge with outpatient followup  arranged.   DISCHARGE INSTRUCTIONS:  1.  He is to stick to a low-fat diet.  2.  He is to apply loose gauze to his ear for drainage as needed.  3.  He is to follow up with Dr. Ezzard Standing and Dr. Perrin Maltese and Dr. [name].  4.  He is to follow up with Dr. Ival Bible P.A. or N.P. on September 20, 2005 at      9:15.   DISCHARGE MEDICATIONS:  1.  Cardizem CD 360 mg a day.  2.  Protonix 40 mg a day.  3.  Metoprolol 25 mg twice daily.  4.  Cipro HC ear drops three drops in the left ear twice daily.  5.  Zetia  10 mg q.a.m.  6.  Lopid 600 mg twice daily.  7.  Crestor is on hold.  8.  Aspirin 325 mg a day.  9.  Prilosec is on hold secondary to Protonix.  10. Requip every morning.      Theodore Demark, P.A. [facility]    ______________________________  Charlton Haws, M.D.    RB/MEDQ  D:  09/02/2005  T:  09/04/2005  Job:  [id number]   cc:   Jonita Albee, M.D.  Fax: [phone]   Venita Lick. [name], M.D. [facility]  [address]  [place]  [place] [address]   Jonelle Sidle, M.D. [facility]  [address]  [place]  [place] [address]

## 2010-11-25 NOTE — Assessment & Plan Note (Signed)
[facility]                            CARDIOLOGY OFFICE NOTE   [name], [name]                        MRN:          [id number]  DATE:11/13/2006                            DOB:          1945/11/12    REASON FOR VISIT:  Follow up atrial fibrillation.   HISTORY OF PRESENT ILLNESS:  I saw Mr. Dobler back in November.  Overall  he has done well since that visit.  He did call in one evening in April  with his wife noting an irregularity to his heartbeat on routine check.  We recommended at that time coming in for a followup electrocardiogram,  although he did not present for this since symptoms spontaneously  resolved.  His electrocardiogram today shows sinus rhythm at 60 beats  per minute.  He reports being compliant with his medications, otherwise  he has had no palpitations.  He does note in retrospect that he has had  some problems with significant pain from plantar fasciitis at the time  that he experienced some heartbeat irregularity.  He has otherwise had  no exertional chest pain or dyspnea.  He continues to scuba dive without  any problems.   ALLERGIES:  NIASPAN, ALSO LIPITOR DUE TO ELEVATED LIVER FUNCTION TESTS.   PRESENT MEDICATIONS:  1. Cardizem CD 260 mg p.o. daily.  2. Metoprolol 25 mg p.o. b.i.d.  3. Zetia 10 mg p.o. daily.  4. Lopid 600 mg p.o. b.i.d.  5. Aspirin 325 mg p.o. daily.  6. Requip as directed.  7. Prilosec OTC.  8. [place]macor 2 tablets b.i.d.   REVIEW OF SYSTEMS:  As described in the history of present illness.   EXAMINATION:  Blood pressure today is 146/83, heart rate is 63, weight  is 200 pounds which is up from 183 one year ago.  The patient is  overweight, in no acute distress.  NECK:  Shows no elevated jugular venous pressure, loud bruits, no  thyromegaly was noted.  LUNGS:  Clear, no labored breathing.  CARDIAC:  Regular rate and rhythm, no loud murmur or gallop.  EXTREMITIES:  Show no pitting edema.   IMPRESSION/RECOMMENDATION:  1. Known paroxysmal atrial fibrillation, overall well controlled      symptomatically on Cardizem CD and metoprolol.  He continues on      aspirin at this time.  He does not have a longstanding history of      hypertension although his blood pressure is elevated today.  I have      asked him to keep a close eye on this at home.  We also talked      about diet and exercise with a goal of weight loss of approximately      15 pounds.  I will plan to see him back over the next 6 months      for symptom review.  2. Further plans to follow.     Jonelle Sidle, MD  Electronically Signed    SGM/MedQ  DD: 11/13/2006  DT: 11/13/2006  Job #: [id number]

## 2010-12-01 ENCOUNTER — Telehealth: Payer: Self-pay | Admitting: Internal Medicine

## 2010-12-01 ENCOUNTER — Telehealth: Payer: Self-pay | Admitting: Gastroenterology

## 2010-12-01 NOTE — Telephone Encounter (Signed)
Left a message informing patient that I saw he called Dr. Koren Bound office about his Pradaxa already trying to see if he needs Lovenox prior to his procedure. Told pt that I would be in touch with Dr. Koren Bound office as well to find out if it is ok that he comes off prior to his procedure.

## 2010-12-01 NOTE — Telephone Encounter (Signed)
I have reviewed holding the pt's Pradaxa with Dr. Graciela Husbands. He has spoken with our pharmacist from [facility], Eda Keys. After discussion, it has been decided that the patient may take his Pradaxa the evening prior to his procedure, then he should hold his Pradaxa the morning of the procedure. He should resume this the night of his procedure if ok with Dr. [name]. I have called the patient to make him aware of this, he was not home, but I did give the message to his wife to relay to the patient and she verbalizes understanding.

## 2010-12-01 NOTE — Telephone Encounter (Signed)
Pt having a procedure next week on December 12, 2010. Pt has question re his pradaxa. Pt wants to talk to a nurse.

## 2010-12-01 NOTE — Telephone Encounter (Signed)
Staff message sent to [facility] stating the pt does not need lovenox bridging. He will only need to hold Pradaxa the morning of procedure and resume the night of his procedure.

## 2010-12-01 NOTE — Telephone Encounter (Signed)
I spoke with the patient. I do see where Dr. [name] has sent Dr. Graciela Husbands a message as to what to do with Pradaxa in regards to his colonoscopy/ endoscopy. The pt does have a history of a stroke. He has been on Lovenox injections before. I will review with Dr. Graciela Husbands. If the patient needs lovenox injections, he needs this called in to [facility] on [address]. I will be in touch with the patient after reviewing with Dr. Graciela Husbands. He is agreeable.

## 2010-12-02 ENCOUNTER — Telehealth: Payer: Self-pay | Admitting: Internal Medicine

## 2010-12-06 ENCOUNTER — Telehealth: Payer: Self-pay

## 2010-12-06 NOTE — Telephone Encounter (Signed)
Spoke with patient to notify him of Dr. Odessa Fleming recommendations and Dr. Ardell Isaacs approval. Pt agreed and verbalized understanding.

## 2010-12-06 NOTE — Telephone Encounter (Signed)
Message copied by Jessee Avers on Tue Dec 06, 2010  9:52 AM ------      Message from: Claudette Head T      Created: Sun Dec 04, 2010 12:37 PM       OK as recommended by Dr. Graciela Husbands. MS      ----- Message -----         From: Christie Nottingham, CMA         Sent: 12/02/2010   8:18 AM           To: Meryl Dare, MD,FACG            Is this ok with you?      ----- Message -----         From: Jefferey Pica, RN         Sent: 12/01/2010   4:55 PM           To: Christie Nottingham, CMA            Per Dr. Graciela Husbands, this patient does not need lovenox bridging with the Pradaxa. He will just need to hold Pradaxa the morning of his procedure, then resume it the night of his procedure if ok with Dr. [name].

## 2010-12-12 ENCOUNTER — Encounter: Payer: Self-pay | Admitting: Gastroenterology

## 2010-12-12 ENCOUNTER — Ambulatory Visit: Payer: BC Managed Care – PPO | Admitting: Gastroenterology

## 2010-12-12 ENCOUNTER — Ambulatory Visit (AMBULATORY_SURGERY_CENTER): Payer: BC Managed Care – PPO | Admitting: Gastroenterology

## 2010-12-12 DIAGNOSIS — K2990 Gastroduodenitis, unspecified, without bleeding: Secondary | ICD-10-CM

## 2010-12-12 DIAGNOSIS — K5730 Diverticulosis of large intestine without perforation or abscess without bleeding: Secondary | ICD-10-CM

## 2010-12-12 DIAGNOSIS — K2270 Barrett's esophagus without dysplasia: Secondary | ICD-10-CM

## 2010-12-12 DIAGNOSIS — Z860100 Personal history of colon polyps, unspecified: Secondary | ICD-10-CM

## 2010-12-12 DIAGNOSIS — Z1211 Encounter for screening for malignant neoplasm of colon: Secondary | ICD-10-CM

## 2010-12-12 MED ORDER — SODIUM CHLORIDE 0.9 % IV SOLN: 500.0000 mL | INTRAVENOUS | Status: DC

## 2010-12-12 NOTE — Patient Instructions (Signed)
DISCHARGE INSTRUCTIONS REVIEWED WITH PATIENT AND CAREPARTNER. INFORMATION ON GASTRITIS , REFLUX, HIATAL HERNIA, DIVERTICULOSIS,AND HEMORRHOIDS GIVEN TO CAREPARTNER. GREEN AND BLUE DISCHARGE SHEETS REVIEWED WITH CAREPARTNER. PATIENT TO CONTINUE REFLUX MEDICINE AND RESUME PRADAXA TOMORROW.

## 2010-12-13 ENCOUNTER — Telehealth: Payer: Self-pay | Admitting: *Deleted

## 2010-12-13 NOTE — Telephone Encounter (Signed)

## 2010-12-19 ENCOUNTER — Encounter: Payer: Self-pay | Admitting: Gastroenterology

## 2010-12-20 ENCOUNTER — Encounter: Payer: Self-pay | Admitting: Gastroenterology

## 2010-12-21 ENCOUNTER — Other Ambulatory Visit: Payer: Self-pay | Admitting: *Deleted

## 2010-12-21 MED ORDER — LOSARTAN POTASSIUM 50 MG PO TABS: 50.0000 mg | ORAL_TABLET | Freq: Every day | ORAL | Status: DC

## 2010-12-27 ENCOUNTER — Encounter: Payer: Self-pay | Admitting: Gastroenterology

## 2010-12-31 ENCOUNTER — Other Ambulatory Visit: Payer: Self-pay | Admitting: Internal Medicine

## 2011-01-19 ENCOUNTER — Other Ambulatory Visit: Payer: Self-pay | Admitting: Internal Medicine

## 2011-02-08 ENCOUNTER — Other Ambulatory Visit: Payer: Self-pay | Admitting: Internal Medicine

## 2011-02-09 ENCOUNTER — Encounter: Payer: Self-pay | Admitting: *Deleted

## 2011-03-03 ENCOUNTER — Other Ambulatory Visit: Payer: Self-pay | Admitting: *Deleted

## 2011-03-03 MED ORDER — DABIGATRAN ETEXILATE MESYLATE 150 MG PO CAPS: 150.0000 mg | ORAL_CAPSULE | Freq: Two times a day (BID) | ORAL | Status: DC

## 2011-03-20 ENCOUNTER — Other Ambulatory Visit: Payer: Self-pay | Admitting: Internal Medicine

## 2011-03-30 ENCOUNTER — Other Ambulatory Visit: Payer: Self-pay | Admitting: Internal Medicine

## 2011-04-04 ENCOUNTER — Other Ambulatory Visit: Payer: Self-pay

## 2011-04-04 MED ORDER — FLECAINIDE ACETATE 100 MG PO TABS: 100.0000 mg | ORAL_TABLET | Freq: Two times a day (BID) | ORAL | Status: DC

## 2011-04-26 LAB — URINALYSIS, ROUTINE W REFLEX MICROSCOPIC
Glucose, UA: NEGATIVE
Nitrite: NEGATIVE
Specific Gravity, Urine: 1.016
pH: 6

## 2011-04-26 LAB — PROTIME-INR
INR: 1.1
INR: 1.2
INR: 1.5

## 2011-04-26 LAB — CBC
HCT: 38.2 — ABNORMAL LOW
HCT: 39.5
HCT: 39.6
HCT: 41.2
HCT: 42.6
HCT: 43.1
Hemoglobin: 13.1
Hemoglobin: 13.6
Hemoglobin: 14.9
MCHC: 34.3
MCHC: 34.4
MCV: 92.5
MCV: 93.6
MCV: 93.8
Platelets: 150
Platelets: 151
Platelets: 158
Platelets: 167
RBC: 4.09 — ABNORMAL LOW
RDW: 13.8
RDW: 13.8
RDW: 13.9
RDW: 14.1 — ABNORMAL HIGH
RDW: 14.1 — ABNORMAL HIGH
RDW: 14.3 — ABNORMAL HIGH
WBC: 6.1

## 2011-04-26 LAB — COMPREHENSIVE METABOLIC PANEL
AST: 33
Albumin: 4.2
Alkaline Phosphatase: 52
BUN: 15
CO2: 25
Chloride: 109
Creatinine, Ser: 1.13
GFR calc Af Amer: >60
GFR calc non Af Amer: >60
Potassium: 4.8
Total Bilirubin: 0.7

## 2011-04-26 LAB — HEPARIN LEVEL (UNFRACTIONATED)
Heparin Unfractionated: 0.24 — ABNORMAL LOW
Heparin Unfractionated: 0.27 — ABNORMAL LOW
Heparin Unfractionated: 0.3
Heparin Unfractionated: 1.02 — ABNORMAL HIGH
Heparin Unfractionated: <0.1 — ABNORMAL LOW

## 2011-04-26 LAB — RAPID URINE DRUG SCREEN, HOSP PERFORMED
Barbiturates: NOT DETECTED
Benzodiazepines: NOT DETECTED
Cocaine: NOT DETECTED

## 2011-04-26 LAB — DIFFERENTIAL
Basophils Absolute: 0
Basophils Relative: 1
Eosinophils Absolute: 0.2
Eosinophils Relative: 4
Lymphocytes Relative: 18
Monocytes Absolute: 0.3

## 2011-04-26 LAB — LIPID PANEL
Cholesterol: 231 — ABNORMAL HIGH
HDL: 36 — ABNORMAL LOW
Total CHOL/HDL Ratio: 6.4
VLDL: UNDETERMINED

## 2011-04-26 LAB — CK TOTAL AND CKMB (NOT AT ARMC): Relative Index: 1.3

## 2011-04-26 LAB — HEMOGLOBIN A1C: Hgb A1c MFr Bld: 5.7

## 2011-05-15 ENCOUNTER — Ambulatory Visit (INDEPENDENT_AMBULATORY_CARE_PROVIDER_SITE_OTHER): Payer: BC Managed Care – PPO | Admitting: Internal Medicine

## 2011-05-15 ENCOUNTER — Encounter: Payer: Self-pay | Admitting: Internal Medicine

## 2011-05-15 DIAGNOSIS — Z950 Presence of cardiac pacemaker: Secondary | ICD-10-CM

## 2011-05-15 DIAGNOSIS — I498 Other specified cardiac arrhythmias: Secondary | ICD-10-CM

## 2011-05-15 DIAGNOSIS — I4891 Unspecified atrial fibrillation: Secondary | ICD-10-CM

## 2011-05-15 DIAGNOSIS — I4892 Unspecified atrial flutter: Secondary | ICD-10-CM

## 2011-05-15 DIAGNOSIS — I6789 Other cerebrovascular disease: Secondary | ICD-10-CM

## 2011-05-15 LAB — PACEMAKER DEVICE OBSERVATION
ATRIAL PACING PM: 34
BRDY-0002RV: 60 {beats}/min
BRDY-0004RV: 130 {beats}/min
DEVICE MODEL PM: 1209334
VENTRICULAR PACING PM: 57

## 2011-05-15 MED ORDER — PROPAFENONE HCL ER 225 MG PO CP12: 225.0000 mg | ORAL_CAPSULE | Freq: Two times a day (BID) | ORAL | Status: DC

## 2011-05-15 NOTE — Progress Notes (Signed)
HPI  [name] is a 65 y.o. male  seen in followup for atrial fibrillation for which he takes flecainide. He had posttermination pauses and has undergone pacemaker implantation. He has had no recurrent syncope.    He says he is able to run and lift weights without shortness of breath with climbing stairs can be an issue.  He is unaware of his frequent afib He continues to scuba dive avidly   Past Medical History  Diagnosis Date  . Stroke 2008  . Atrial fibrillation   . OSA (obstructive sleep apnea)   . Allergic rhinitis   . Hyperlipidemia   . Asthma   . GERD (gastroesophageal reflux disease)   . Barrett's esophagus 03/1993  . RLS (restless legs syndrome)   . Tubular adenoma polyp of rectum 07/1998  . Hiatal hernia   . Hemorrhoids   . AVM (arteriovenous malformation)   . Cataract   . Hypertension     Past Surgical History  Procedure Date  . Cholecystectomy open   . Knee arthroscopy   . Tonsillectomy   . Pacemaker insertion     [employer] Dr. Dannielle Karvonen    Current Outpatient Prescriptions  Medication Sig Dispense Refill  . cholecalciferol (VITAMIN D) 1000 UNITS tablet Take 1,000 Units by mouth daily.        . colesevelam (WELCHOL) 625 MG tablet Take 1,875 mg by mouth 2 (two) times daily with a meal.        . dabigatran (PRADAXA) 150 MG CAPS Take 1 capsule (150 mg total) by mouth every 12 (twelve) hours.  60 capsule  6  . ezetimibe (ZETIA) 10 MG tablet Take 10 mg by mouth daily.        . fenofibrate 160 MG tablet TAKE 1 TABLET BY MOUTH DAILY WITH FOOD  30 tablet  5  . flecainide (TAMBOCOR) 100 MG tablet Take 1 tablet (100 mg total) by mouth 2 (two) times daily.  60 tablet  6  . glucosamine-chondroitin 500-400 MG tablet Take 1 tablet by mouth 2 (two) times daily.        Marland Kitchen losartan (COZAAR) 50 MG tablet Take 1 tablet (50 mg total) by mouth daily.  30 tablet  11  . MATZIM LA 180 MG 24 hr tablet TAKE ONE TABLET TWO TIMES A DAY  60 tablet  3  . metoprolol tartrate (LOPRESSOR)  25 MG tablet TAKE ONE TABLET TWO TIMES A DAY  60 tablet  6  . mometasone (NASONEX) 50 MCG/ACT nasal spray 2 sprays by Nasal route daily.  17 g  prn  . Multiple Vitamin (MULTIVITAMIN) capsule Take 1 capsule by mouth daily.        . Omega-3 Fatty Acids (FISH OIL) 1000 MG CAPS Take 1 capsule by mouth 2 (two) times daily.        Marland Kitchen omeprazole (PRILOSEC) 40 MG capsule TAKE ONE CAPSULE BY MOUTH EVERY DAY  30 capsule  5  . rOPINIRole (REQUIP) 2 MG tablet Take 2 mg by mouth at bedtime.        . Testosterone (ANDROGEL) 25 MG/2.5GM GEL Use as directed        Current Facility-Administered Medications  Medication Dose Route Frequency Provider Last Rate Last Dose  . 0.9 %  sodium chloride infusion  500 mL Intravenous Continuous Eliezer Bottom., MD,FACG        Allergies  Allergen Reactions  . Niacin   . Statins     Liver damage    Review  of Systems negative except from HPI and PMH  Physical Exam Well developed and well nourished in no acute distress HENT normal E scleral and icterus clear Neck Supple JVP flat; carotids brisk and full Clear to ausculation Irregular rate and rhythm without significant murmurs Soft with active bowel sounds No clubbing cyanosis and edema Alert and oriented, grossly normal motor and sensory function Skin Warm and Dry     Assessment and  Plan

## 2011-05-15 NOTE — Assessment & Plan Note (Signed)
The patient's device was interrogated.  The information was reviewed. No changes were made in the programming.    

## 2011-05-15 NOTE — Assessment & Plan Note (Signed)
Continue on pradaxa

## 2011-05-15 NOTE — Assessment & Plan Note (Signed)
He is having 57% atrial fibrillation. Heart rates are adequately controlled and he is without symptoms. We have discussed the theoretical benefit of the maintaining a sinus rhythm In anticipation of the results of the [place] Trial. For that we will discontinue his flecainide and try him on Rythmol.  At his next visit we will schedule an echo also to look at left atrial dimension as it is this is 2008 that we had done this.

## 2011-05-15 NOTE — Patient Instructions (Addendum)
Your physician has recommended you make the following change in your medication:  1) Stop flecainide. 2) Start rythymol (propafenone) 225mg  one tablet by mouth twice daily.  Your physician recommends that you schedule a follow-up appointment in: 3 months with Dr. Graciela Husbands.  Your physician has requested that you have an echocardiogram the day you see Dr. Graciela Husbands in 3 months. Echocardiography is a painless test that uses sound waves to create images of your heart. It provides your doctor with information about the size and shape of your heart and how well your heart's chambers and valves are working. This procedure takes approximately one hour. There are no restrictions for this procedure.

## 2011-06-15 ENCOUNTER — Ambulatory Visit: Admitting: Physician Assistant

## 2011-06-15 ENCOUNTER — Other Ambulatory Visit (INDEPENDENT_AMBULATORY_CARE_PROVIDER_SITE_OTHER): Payer: BC Managed Care – PPO

## 2011-06-15 DIAGNOSIS — N529 Male erectile dysfunction, unspecified: Secondary | ICD-10-CM

## 2011-06-15 DIAGNOSIS — S0005XA Superficial foreign body of scalp, initial encounter: Secondary | ICD-10-CM

## 2011-06-15 DIAGNOSIS — E782 Mixed hyperlipidemia: Secondary | ICD-10-CM

## 2011-06-15 DIAGNOSIS — S1095XA Superficial foreign body of unspecified part of neck, initial encounter: Secondary | ICD-10-CM

## 2011-06-15 DIAGNOSIS — G2581 Restless legs syndrome: Secondary | ICD-10-CM

## 2011-06-29 ENCOUNTER — Encounter: Payer: Self-pay | Admitting: Internal Medicine

## 2011-06-30 ENCOUNTER — Other Ambulatory Visit: Payer: Self-pay | Admitting: Internal Medicine

## 2011-07-06 ENCOUNTER — Telehealth: Payer: Self-pay | Admitting: *Deleted

## 2011-07-06 NOTE — Telephone Encounter (Signed)
Refill

## 2011-07-14 ENCOUNTER — Telehealth: Payer: Self-pay | Admitting: Internal Medicine

## 2011-07-14 DIAGNOSIS — I4891 Unspecified atrial fibrillation: Secondary | ICD-10-CM

## 2011-07-14 MED ORDER — DILTIAZEM HCL ER COATED BEADS 180 MG PO TB24: ORAL_TABLET | ORAL | Status: DC

## 2011-07-14 NOTE — Telephone Encounter (Signed)
RX refill sent in the pharmacy. I have left a message on the patient's identified voice mail that this has been done.

## 2011-07-14 NOTE — Telephone Encounter (Signed)
Pt has run out of matzim 180mg  BID and has been out for two weeks [facility] [address]

## 2011-07-26 ENCOUNTER — Encounter: Payer: Self-pay | Admitting: Physician Assistant

## 2011-07-26 DIAGNOSIS — K2270 Barrett's esophagus without dysplasia: Secondary | ICD-10-CM

## 2011-07-26 DIAGNOSIS — E291 Testicular hypofunction: Secondary | ICD-10-CM | POA: Insufficient documentation

## 2011-07-26 DIAGNOSIS — K449 Diaphragmatic hernia without obstruction or gangrene: Secondary | ICD-10-CM | POA: Insufficient documentation

## 2011-07-28 ENCOUNTER — Encounter (HOSPITAL_COMMUNITY): Payer: Self-pay | Admitting: *Deleted

## 2011-07-28 ENCOUNTER — Emergency Department (HOSPITAL_COMMUNITY): Payer: Medicare Other

## 2011-07-28 ENCOUNTER — Emergency Department (HOSPITAL_COMMUNITY)
Admission: EM | Admit: 2011-07-28 | Discharge: 2011-07-29 | Disposition: A | Discharge status: Home / Self Care | Payer: Medicare Other | Attending: Emergency Medicine | Admitting: Emergency Medicine

## 2011-07-28 DIAGNOSIS — I4891 Unspecified atrial fibrillation: Secondary | ICD-10-CM | POA: Insufficient documentation

## 2011-07-28 DIAGNOSIS — N200 Calculus of kidney: Secondary | ICD-10-CM

## 2011-07-28 DIAGNOSIS — K219 Gastro-esophageal reflux disease without esophagitis: Secondary | ICD-10-CM | POA: Insufficient documentation

## 2011-07-28 DIAGNOSIS — Z79899 Other long term (current) drug therapy: Secondary | ICD-10-CM | POA: Insufficient documentation

## 2011-07-28 DIAGNOSIS — R319 Hematuria, unspecified: Secondary | ICD-10-CM | POA: Insufficient documentation

## 2011-07-28 DIAGNOSIS — E785 Hyperlipidemia, unspecified: Secondary | ICD-10-CM | POA: Insufficient documentation

## 2011-07-28 DIAGNOSIS — R109 Unspecified abdominal pain: Secondary | ICD-10-CM | POA: Insufficient documentation

## 2011-07-28 DIAGNOSIS — G4733 Obstructive sleep apnea (adult) (pediatric): Secondary | ICD-10-CM | POA: Insufficient documentation

## 2011-07-28 DIAGNOSIS — K2270 Barrett's esophagus without dysplasia: Secondary | ICD-10-CM | POA: Insufficient documentation

## 2011-07-28 DIAGNOSIS — R110 Nausea: Secondary | ICD-10-CM | POA: Insufficient documentation

## 2011-07-28 DIAGNOSIS — N201 Calculus of ureter: Secondary | ICD-10-CM | POA: Insufficient documentation

## 2011-07-28 DIAGNOSIS — Z8673 Personal history of transient ischemic attack (TIA), and cerebral infarction without residual deficits: Secondary | ICD-10-CM | POA: Insufficient documentation

## 2011-07-28 DIAGNOSIS — J45909 Unspecified asthma, uncomplicated: Secondary | ICD-10-CM | POA: Insufficient documentation

## 2011-07-28 DIAGNOSIS — N1330 Unspecified hydronephrosis: Secondary | ICD-10-CM | POA: Insufficient documentation

## 2011-07-28 DIAGNOSIS — I10 Essential (primary) hypertension: Secondary | ICD-10-CM | POA: Insufficient documentation

## 2011-07-28 LAB — DIFFERENTIAL
Basophils Absolute: 0 10*3/uL (ref 0.0–0.1)
Eosinophils Relative: 3 % (ref 0–5)
Lymphocytes Relative: 20 % (ref 12–46)
Monocytes Absolute: 0.6 10*3/uL (ref 0.1–1.0)

## 2011-07-28 LAB — CBC
HCT: 39.8 % (ref 39.0–52.0)
MCV: 94.3 fL (ref 78.0–100.0)
RDW: 13.9 % (ref 11.5–15.5)
WBC: 5.3 10*3/uL (ref 4.0–10.5)

## 2011-07-28 LAB — URINALYSIS, ROUTINE W REFLEX MICROSCOPIC
Glucose, UA: NEGATIVE mg/dL
Ketones, ur: NEGATIVE mg/dL
Protein, ur: NEGATIVE mg/dL

## 2011-07-28 LAB — BASIC METABOLIC PANEL
CO2: 22 mEq/L (ref 19–32)
Calcium: 9.5 mg/dL (ref 8.4–10.5)
Creatinine, Ser: 1.47 mg/dL — ABNORMAL HIGH (ref 0.50–1.35)
Glucose, Bld: 121 mg/dL — ABNORMAL HIGH (ref 70–99)

## 2011-07-28 LAB — URINE MICROSCOPIC-ADD ON

## 2011-07-28 MED ORDER — SODIUM CHLORIDE 0.9 % IV SOLN
INTRAVENOUS | Status: DC
  Administered 2011-07-28: 23:00:00 via INTRAVENOUS

## 2011-07-28 MED ORDER — ONDANSETRON HCL 4 MG/2ML IJ SOLN
4.0000 mg | Freq: Once | INTRAMUSCULAR | Status: AC
  Administered 2011-07-28: 4 mg via INTRAVENOUS
  Filled 2011-07-28: qty 2

## 2011-07-28 MED ORDER — HYDROMORPHONE HCL PF 1 MG/ML IJ SOLN
1.0000 mg | Freq: Once | INTRAMUSCULAR | Status: AC
  Administered 2011-07-28: 1 mg via INTRAVENOUS
  Filled 2011-07-28: qty 1

## 2011-07-28 NOTE — ED Provider Notes (Signed)
History     CSN: [id number]  Arrival date & time 07/28/11  2230   First MD Initiated Contact with Patient 07/28/11 2304      Chief Complaint  Patient presents with  . Flank Pain    (Consider location/radiation/quality/duration/timing/severity/associated sxs/prior treatment) Patient is a 66 y.o. male presenting with flank pain. The history is provided by the patient.  Flank Pain This is a new problem. The current episode started 3 to 5 hours ago. The problem occurs constantly. The problem has not changed since onset.Associated symptoms include abdominal pain. Pertinent negatives include no shortness of breath. Associated symptoms comments: Pain in the left midabdomen. Exacerbated by: Palpation not affected by urination. The symptoms are relieved by nothing. He has tried acetaminophen for the symptoms. The treatment provided no relief.    Past Medical History  Diagnosis Date  . Stroke 2008  . Atrial fibrillation   . OSA (obstructive sleep apnea)   . Allergic rhinitis   . Hyperlipidemia   . Asthma   . GERD (gastroesophageal reflux disease)   . Barrett's esophagus 03/1993  . RLS (restless legs syndrome)   . Tubular adenoma polyp of rectum 07/1998  . Hiatal hernia   . Hemorrhoids   . AVM (arteriovenous malformation)   . Cataract   . Hypertension     Past Surgical History  Procedure Date  . Cholecystectomy open   . Knee arthroscopy   . Tonsillectomy   . Pacemaker insertion     St. Jude Dr. Dannielle Karvonen    Family History  Problem Relation Age of Onset  . Sleep apnea Brother   . Stroke Mother   . Heart attack Father     History  Substance Use Topics  . Smoking status: Never Smoker   . Smokeless tobacco: Never Used  . Alcohol Use: Yes     social      Review of Systems  Constitutional: Negative for fever and fatigue.  Respiratory: Negative for cough and shortness of breath.   Gastrointestinal: Positive for nausea and abdominal pain. Negative for vomiting and  diarrhea.  Genitourinary: Positive for hematuria and flank pain. Negative for dysuria, urgency, frequency and testicular pain.  All other systems reviewed and are negative.    Allergies  Niacin and Statins  Home Medications   Current Outpatient Rx  Name Route Sig Dispense Refill  . VITAMIN D 1000 UNITS PO TABS Oral Take 1,000 Units by mouth daily.      . COLESEVELAM HCL 625 MG PO TABS Oral Take 1,875 mg by mouth 2 (two) times daily with a meal.      . DABIGATRAN ETEXILATE MESYLATE 150 MG PO CAPS Oral Take 150 mg by mouth every 12 (twelve) hours.    Marland Kitchen DILTIAZEM HCL ER COATED BEADS 180 MG PO TB24  Take one tablet by mouth twice daily.    Marland Kitchen EZETIMIBE 10 MG PO TABS Oral Take 10 mg by mouth daily.      . FENOFIBRATE 160 MG PO TABS Oral Take 160 mg by mouth daily.     Marland Kitchen FLUVOXAMINE MALEATE 100 MG PO TABS Oral Take 100 mg by mouth at bedtime.    Marland Kitchen GLUCOSAMINE-CHONDROITIN 500-400 MG PO TABS Oral Take 1 tablet by mouth 2 (two) times daily.      Marland Kitchen LOSARTAN POTASSIUM 50 MG PO TABS Oral Take 50 mg by mouth daily.    Marland Kitchen METOPROLOL TARTRATE 25 MG PO TABS Oral Take 25 mg by mouth 2 (two) times daily.     Marland Kitchen  MOMETASONE FUROATE 50 MCG/ACT NA SUSP Nasal Place 2 sprays into the nose daily as needed. For nasal congestion    . ADULT MULTIVITAMIN W/MINERALS CH Oral Take 1 tablet by mouth daily.    Marland Kitchen FISH OIL 1000 MG PO CAPS Oral Take 1 capsule by mouth 2 (two) times daily.      Marland Kitchen OMEPRAZOLE 40 MG PO CPDR Oral Take 40 mg by mouth daily.     Marland Kitchen PROPAFENONE HCL ER 225 MG PO CP12 Oral Take 225 mg by mouth 2 (two) times daily.    Marland Kitchen ROPINIROLE HCL 2 MG PO TABS Oral Take 2 mg by mouth at bedtime.      . TESTOSTERONE 25 MG/2.5GM TD GEL  Use as directed       BP 143/83  Pulse 74  Temp(Src) 97.5 F (36.4 C) (Oral)  Resp 22  SpO2 98%  Physical Exam  Nursing note and vitals reviewed. Constitutional: He is oriented to person, place, and time. He appears well-developed and well-nourished. No distress.  HENT:    Head: Normocephalic and atraumatic.  Mouth/Throat: Oropharynx is clear and moist.  Eyes: Conjunctivae and EOM are normal. Pupils are equal, round, and reactive to light.  Neck: Normal range of motion. Neck supple.  Cardiovascular: Normal rate, regular rhythm and intact distal pulses.   No murmur heard. Pulmonary/Chest: Effort normal and breath sounds normal. No respiratory distress. He has no wheezes. He has no rales.  Abdominal: Soft. He exhibits no distension. There is tenderness. There is CVA tenderness. There is no rebound and no guarding. No hernia.    Musculoskeletal: Normal range of motion. He exhibits no edema and no tenderness.  Neurological: He is alert and oriented to person, place, and time.  Skin: Skin is warm and dry. No rash noted. No erythema.  Psychiatric: He has a normal mood and affect. His behavior is normal.    ED Course  Procedures (including critical care time)  Labs Reviewed  URINALYSIS, ROUTINE W REFLEX MICROSCOPIC - Abnormal; Notable for the following:    APPearance CLOUDY (*)    Hgb urine dipstick LARGE (*)    All other components within normal limits  CBC - Abnormal; Notable for the following:    Platelets 149 (*)    All other components within normal limits  BASIC METABOLIC PANEL - Abnormal; Notable for the following:    Glucose, Bld 121 (*)    Creatinine, Ser 1.47 (*)    GFR calc non Af Amer 49 (*)    GFR calc Af Amer 56 (*)    All other components within normal limits  URINE MICROSCOPIC-ADD ON - Abnormal; Notable for the following:    Crystals URIC ACID CRYSTALS (*)    All other components within normal limits  DIFFERENTIAL   Ct Abdomen Pelvis Wo Contrast  07/29/2011  *RADIOLOGY REPORT*  Clinical Data: Left flank pain.  CT ABDOMEN AND PELVIS WITHOUT CONTRAST  Technique:  Multidetector CT imaging of the abdomen and pelvis was performed following the standard protocol without intravenous contrast.  Comparison: None.  Findings: Pacer wires are  noted in the right side of the heart. Heart is normal size.  Lung bases clear.  No effusions.  Mild left hydronephrosis and perinephric stranding due to a 4 x 3 mm proximal to mid left ureteral stones.  No additional ureteral stones.  No hydronephrosis or stones on the right.  Prior cholecystectomy.  Liver, spleen, pancreas, adrenals, stomach unremarkable.  Descending colonic and sigmoid diverticula without diverticulitis.  Small bowel is decompressed.  Appendix is visualized and is normal.  No free fluid, free air or adenopathy. Small bilateral inguinal hernias containing fat.  Aorta is calcified, non-aneurysmal.  IMPRESSION: 4 x 3 mm proximal to mid left ureteral stone with mild left hydronephrosis.  Descending colonic and sigmoid diverticulosis.  Original Report Authenticated By: Cyndie Chime, M.D.     No diagnosis found.    MDM   Pt with symptoms consistent with kidney stone.  Denies infectious sx, or GI symptoms.  Low concern for diverticulitis and no risk factors or history suggestive of AAA.  No hx suggestive of GU source (discharge) and otherwise pt is healthy.  Will hydrate, treat pain and ensure no infection with UA, CBC, CMP and will get stone study to further eval.  UA with too numerous to count red blood cells with a normal CBC and BMP. CT of the abdomen pending.    12:39 AM Patient with a 3 x 4 stone in the proximal left ureter however on repeat exam the pain has moved down to his lower abdomen this and feel that the stone is moving. Patient required another dose of Dilaudid unable to give Toradol due to being on Pradaxa.  Given oral Percocet as well.  1:35 AM Patient's pain is completely resolved and feel that he most likely has passed a stone.  Gwyneth Sprout, MD 07/29/11 [phone]

## 2011-07-28 NOTE — ED Notes (Signed)
The pt has lt flank pain for 2 days with dark colored urine then also

## 2011-07-29 MED ORDER — OXYCODONE-ACETAMINOPHEN 5-325 MG PO TABS: 1.0000 | ORAL_TABLET | Freq: Four times a day (QID) | ORAL | Status: AC | PRN

## 2011-07-29 MED ORDER — HYDROMORPHONE HCL PF 1 MG/ML IJ SOLN
1.0000 mg | Freq: Once | INTRAMUSCULAR | Status: AC
  Administered 2011-07-29: 1 mg via INTRAVENOUS
  Filled 2011-07-29: qty 1

## 2011-07-29 MED ORDER — OXYCODONE-ACETAMINOPHEN 5-325 MG PO TABS
2.0000 | ORAL_TABLET | Freq: Once | ORAL | Status: DC
  Filled 2011-07-29: qty 2

## 2011-07-30 ENCOUNTER — Ambulatory Visit (INDEPENDENT_AMBULATORY_CARE_PROVIDER_SITE_OTHER): Payer: Medicare Other

## 2011-07-30 DIAGNOSIS — N200 Calculus of kidney: Secondary | ICD-10-CM

## 2011-07-30 DIAGNOSIS — N189 Chronic kidney disease, unspecified: Secondary | ICD-10-CM

## 2011-07-30 DIAGNOSIS — I10 Essential (primary) hypertension: Secondary | ICD-10-CM

## 2011-08-15 ENCOUNTER — Encounter: Payer: Self-pay | Admitting: Internal Medicine

## 2011-08-15 ENCOUNTER — Ambulatory Visit (HOSPITAL_COMMUNITY): Payer: Medicare Other | Attending: Cardiology | Admitting: Radiology

## 2011-08-15 ENCOUNTER — Ambulatory Visit (INDEPENDENT_AMBULATORY_CARE_PROVIDER_SITE_OTHER): Payer: Medicare Other | Admitting: Internal Medicine

## 2011-08-15 DIAGNOSIS — Z950 Presence of cardiac pacemaker: Secondary | ICD-10-CM

## 2011-08-15 DIAGNOSIS — G4733 Obstructive sleep apnea (adult) (pediatric): Secondary | ICD-10-CM | POA: Insufficient documentation

## 2011-08-15 DIAGNOSIS — I4891 Unspecified atrial fibrillation: Secondary | ICD-10-CM

## 2011-08-15 DIAGNOSIS — I079 Rheumatic tricuspid valve disease, unspecified: Secondary | ICD-10-CM | POA: Insufficient documentation

## 2011-08-15 DIAGNOSIS — I059 Rheumatic mitral valve disease, unspecified: Secondary | ICD-10-CM | POA: Insufficient documentation

## 2011-08-15 DIAGNOSIS — E785 Hyperlipidemia, unspecified: Secondary | ICD-10-CM | POA: Insufficient documentation

## 2011-08-15 DIAGNOSIS — I495 Sick sinus syndrome: Secondary | ICD-10-CM

## 2011-08-15 DIAGNOSIS — R55 Syncope and collapse: Secondary | ICD-10-CM

## 2011-08-15 DIAGNOSIS — E669 Obesity, unspecified: Secondary | ICD-10-CM | POA: Insufficient documentation

## 2011-08-15 DIAGNOSIS — I10 Essential (primary) hypertension: Secondary | ICD-10-CM | POA: Insufficient documentation

## 2011-08-15 DIAGNOSIS — I4892 Unspecified atrial flutter: Secondary | ICD-10-CM

## 2011-08-15 LAB — PACEMAKER DEVICE OBSERVATION
ATRIAL PACING PM: 16
BRDY-0002RV: 60 {beats}/min
BRDY-0004RV: 130 {beats}/min
DEVICE MODEL PM: 1209334
RV LEAD THRESHOLD: 1 V

## 2011-08-15 NOTE — Assessment & Plan Note (Signed)
The patient's device was interrogated.  The information was reviewed. No changes were made in the programming.    

## 2011-08-15 NOTE — Assessment & Plan Note (Signed)
No recurrent syncope 

## 2011-08-15 NOTE — Progress Notes (Signed)
HPI  [name] is a 66 y.o. male  seen in followup for atrial fibrillation for which he took flecainide but because of freq (57%) afib we changed at his last visit to rhyhtmol   He had posttermination pauses and has undergone pacemaker implantation. He has had no recurrent syncope.    He says he is able to run and lift weights without shortness of breath with climbing stairs can be an issue.  He is unaware of his frequent afib although he now wonders whether contributes to fatigue He continues to scuba dive avidly   Past Medical History  Diagnosis Date  . Stroke 2008  . Atrial fibrillation   . OSA (obstructive sleep apnea)   . Allergic rhinitis   . Hyperlipidemia   . Asthma   . GERD (gastroesophageal reflux disease)   . Barrett's esophagus 03/1993  . RLS (restless legs syndrome)   . Tubular adenoma polyp of rectum 07/1998  . Hiatal hernia   . Hemorrhoids   . AVM (arteriovenous malformation)   . Cataract   . Hypertension     Past Surgical History  Procedure Date  . Cholecystectomy open   . Knee arthroscopy   . Tonsillectomy   . Pacemaker insertion     St. Jude Dr. Dannielle Karvonen    Current Outpatient Prescriptions  Medication Sig Dispense Refill  . cholecalciferol (VITAMIN D) 1000 UNITS tablet Take 1,000 Units by mouth daily.        . colesevelam (WELCHOL) 625 MG tablet Take 1,875 mg by mouth 2 (two) times daily with a meal.        . dabigatran (PRADAXA) 150 MG CAPS Take 150 mg by mouth every 12 (twelve) hours.      Marland Kitchen diltiazem (CARDIZEM LA) 180 MG 24 hr tablet Take one tablet by mouth twice daily.      Marland Kitchen ezetimibe (ZETIA) 10 MG tablet Take 10 mg by mouth daily.        . fenofibrate 160 MG tablet Take 160 mg by mouth daily.       . fluvoxaMINE (LUVOX) 100 MG tablet Take 100 mg by mouth at bedtime.      Marland Kitchen glucosamine-chondroitin 500-400 MG tablet Take 1 tablet by mouth 2 (two) times daily.        Marland Kitchen losartan (COZAAR) 50 MG tablet Take 50 mg by mouth daily.      .  metoprolol tartrate (LOPRESSOR) 25 MG tablet Take 25 mg by mouth 2 (two) times daily.       . mometasone (NASONEX) 50 MCG/ACT nasal spray Place 2 sprays into the nose daily as needed. For nasal congestion      . Multiple Vitamin (MULITIVITAMIN WITH MINERALS) TABS Take 1 tablet by mouth daily.      . Omega-3 Fatty Acids (FISH OIL) 1000 MG CAPS Take 1 capsule by mouth 2 (two) times daily.        Marland Kitchen omeprazole (PRILOSEC) 40 MG capsule Take 40 mg by mouth daily.       . propafenone (RYTHMOL SR) 225 MG 12 hr capsule Take 225 mg by mouth 2 (two) times daily.      Marland Kitchen rOPINIRole (REQUIP) 2 MG tablet Take 2 mg by mouth at bedtime.        . Testosterone (ANDROGEL) 25 MG/2.5GM GEL Use as directed        Current Facility-Administered Medications  Medication Dose Route Frequency Provider Last Rate Last Dose  . 0.9 %  sodium chloride infusion  500 mL Intravenous Continuous Eliezer Bottom., MD,FACG        Allergies  Allergen Reactions  . Niacin Itching  . Statins     Liver damage    Review of Systems negative except from HPI and PMH  Physical Exam Well developed and well nourished in no acute distress HENT normal E scleral and icterus clear Neck Supple JVP flat; carotids brisk and full Clear to ausculation Irregular rate and rhythm without significant murmurs Soft with active bowel sounds No clubbing cyanosis and edema Alert and oriented, grossly normal motor and sensory function Skin Warm and Dry     Assessment and  Plan

## 2011-08-15 NOTE — Assessment & Plan Note (Signed)
He continues to have episodes of atrial fibrillation. This is true notwithstanding the use of 21C antiarrhythmic drugs. He thinks maybe there are some symptoms related to fatigue. I have discussed with him about talking with Dr. Johney Frame concerning catheter ablation notwithstanding the lack of firm data on endpoints. He is willing to do so. I think is a reasonable thing to consider given his vigor. An alternative approach would be to do nothing. We will plan to discontinue his 1C drug 3 or 4 weeks prior to seeing Dr. Johney Frame to see if there is any appreciable change in his atrial fibrillation burden. Another step would be to put him on dofetilide

## 2011-08-15 NOTE — Patient Instructions (Signed)
Your physician recommends that you schedule a follow-up appointment in: April 2013 with Dr. Johney Frame (consult for a-fib).  Your physician wants you to follow-up in: 6 months with Kristin/ Gunnar Fusi & 1 year with Dr. Graciela Husbands. You will receive a reminder letter in the mail two months in advance. If you don't receive a letter, please call our office to schedule the follow-up appointment.

## 2011-08-21 ENCOUNTER — Other Ambulatory Visit: Payer: Self-pay | Admitting: Internal Medicine

## 2011-09-03 ENCOUNTER — Other Ambulatory Visit: Payer: Self-pay | Admitting: Internal Medicine

## 2011-10-16 ENCOUNTER — Encounter: Payer: Self-pay | Admitting: Internal Medicine

## 2011-10-16 ENCOUNTER — Other Ambulatory Visit: Payer: Self-pay

## 2011-10-16 ENCOUNTER — Ambulatory Visit (INDEPENDENT_AMBULATORY_CARE_PROVIDER_SITE_OTHER): Payer: Medicare Other | Admitting: Internal Medicine

## 2011-10-16 VITALS — BP 142/101 | HR 65 | Resp 18 | Ht 70.0 in | Wt 214.8 lb

## 2011-10-16 DIAGNOSIS — I495 Sick sinus syndrome: Secondary | ICD-10-CM

## 2011-10-16 DIAGNOSIS — I4891 Unspecified atrial fibrillation: Secondary | ICD-10-CM

## 2011-10-16 DIAGNOSIS — G4733 Obstructive sleep apnea (adult) (pediatric): Secondary | ICD-10-CM

## 2011-10-16 DIAGNOSIS — I6789 Other cerebrovascular disease: Secondary | ICD-10-CM

## 2011-10-16 LAB — PACEMAKER DEVICE OBSERVATION: BAMS-0001: 150 {beats}/min

## 2011-10-16 MED ORDER — FENOFIBRATE 160 MG PO TABS: 160.0000 mg | ORAL_TABLET | Freq: Every day | ORAL | Status: DC

## 2011-10-16 NOTE — Progress Notes (Signed)
Primary Care Physician: Lucilla Edin, MD, MD Referring Physician:  Dr Beverely Pace is a 66 y.o. male with a h/o paroxysmal atrial fibrillation who presents today for further EP evaluation.  He reports initially being diagnosed with atrial fibrillation in 1998 after presenting with tachypalpitations to a hospital in [place].  He was placed on diltiazem.  He had increasing frequency and duration of afib thereafter.  He presented with an acute embolic stroke 12/2006.  This was felt to be due to afib and he was therefore placed on coumadin.  He was subsequently evaluated by Dr Graciela Husbands.  He was found to have post termination pauses with his afib and therefore underwent pacemaker implantation 11/15/07.  He has been treated with flecainide but continued to have increasing afib burden.  He subsequently has had propafenone added to his flecainide.  Despite this, his afib burden continues to increase.  The patient has been treated with CPAP for OSA.  He has not found any other triggers for his afib.  He reports fatigue and decreased exercise tolerance with his afib.  He reports mild SOB with moderate activity.  Recent echo reveals preserved EF with LA size of 46mm.  His coumadin has been transitioned to pradaxa.  He now presents for consideration of afib ablation.   Past Medical History  Diagnosis Date  . Stroke 2008  . Atrial fibrillation   . OSA (obstructive sleep apnea)     on CPAP  . Allergic rhinitis   . Hyperlipidemia   . Asthma   . GERD (gastroesophageal reflux disease)   . Barrett's esophagus 03/1993  . RLS (restless legs syndrome)   . Tubular adenoma polyp of rectum 07/1998  . Hiatal hernia   . Hemorrhoids   . AVM (arteriovenous malformation)   . Cataract   . Hypertension   . Sinoatrial node dysfunction     s/p PPM   Past Surgical History  Procedure Date  . Cholecystectomy open   . Knee arthroscopy   . Tonsillectomy   . Pacemaker insertion 11/15/07    St. Jude Dr. Dannielle Karvonen  implanted by Dr Graciela Husbands    Current Outpatient Prescriptions  Medication Sig Dispense Refill  . cholecalciferol (VITAMIN D) 1000 UNITS tablet Take 1,000 Units by mouth daily.        . colesevelam (WELCHOL) 625 MG tablet Take 1,875 mg by mouth 2 (two) times daily with a meal.        . dabigatran (PRADAXA) 150 MG CAPS Take 150 mg by mouth every 12 (twelve) hours.      Marland Kitchen diltiazem (CARDIZEM LA) 180 MG 24 hr tablet Take one tablet by mouth twice daily.      Marland Kitchen ezetimibe (ZETIA) 10 MG tablet Take 10 mg by mouth daily.        . fenofibrate 160 MG tablet Take 160 mg by mouth daily.       . fluvoxaMINE (LUVOX) 100 MG tablet Take 100 mg by mouth at bedtime.      Marland Kitchen glucosamine-chondroitin 500-400 MG tablet Take 1 tablet by mouth 2 (two) times daily.        Marland Kitchen losartan (COZAAR) 50 MG tablet Take 50 mg by mouth daily.      . mometasone (NASONEX) 50 MCG/ACT nasal spray Place 2 sprays into the nose daily as needed. For nasal congestion      . Multiple Vitamin (MULITIVITAMIN WITH MINERALS) TABS Take 1 tablet by mouth daily.      Marland Kitchen  Omega-3 Fatty Acids (FISH OIL) 1000 MG CAPS Take 1 capsule by mouth 2 (two) times daily.        Marland Kitchen omeprazole (PRILOSEC) 40 MG capsule Take 40 mg by mouth daily.       Marland Kitchen omeprazole (PRILOSEC) 40 MG capsule TAKE ONE CAPSULE BY MOUTH EVERY DAY  30 capsule  5  . PRADAXA 150 MG CAPS TAKE 1 CAPSULE (150 MG TOTAL) BY MOUTH EVERY 12 (TWELVE) HOURS.  60 capsule  6  . propafenone (RYTHMOL SR) 225 MG 12 hr capsule Take 225 mg by mouth 2 (two) times daily.      Marland Kitchen rOPINIRole (REQUIP) 2 MG tablet Take 2 mg by mouth at bedtime.        . Testosterone (ANDROGEL) 25 MG/2.5GM GEL Use as directed       . metoprolol tartrate (LOPRESSOR) 25 MG tablet Take 25 mg by mouth 2 (two) times daily.        Current Facility-Administered Medications  Medication Dose Route Frequency Provider Last Rate Last Dose  . 0.9 %  sodium chloride infusion  500 mL Intravenous Continuous Meryl Dare, MD,FACG         Allergies  Allergen Reactions  . Niacin Itching  . Statins     Liver damage    History   Social History  . Marital Status: Married    Spouse Name: N/A    Number of Children: 1  . Years of Education: N/A   Occupational History  . Geophysicist/field seismologist   .     Social History Main Topics  . Smoking status: Never Smoker   . Smokeless tobacco: Never Used  . Alcohol Use: Yes     social  . Drug Use: No  . Sexually Active: Not on file   Other Topics Concern  . Not on file   Social History Narrative   Pt lives in [place].  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  Enjoys scuba diving.    Family History  Problem Relation Age of Onset  . Sleep apnea Brother   . Stroke Mother   . Heart attack Father     ROS- All systems are reviewed and negative except as per the HPI above  Physical Exam: Filed Vitals:   10/16/11 1018  BP: 142/101  Pulse: 65  Resp: 18  Height: 5\' 10"  (1.778 m)  Weight: 214 lb 12.8 oz (97.433 kg)    GEN- The patient is well appearing, alert and oriented x 3 today.   Head- normocephalic, atraumatic Eyes-  Sclera clear, conjunctiva pink Ears- hearing intact Oropharynx- clear Neck- supple, no JVP Lymph- no cervical lymphadenopathy Lungs- Clear to ausculation bilaterally, normal work of breathing Heart- Regular rate and rhythm, no murmurs, rubs or gallops, PMI not laterally displaced GI- soft, NT, ND, + BS Extremities- no clubbing, cyanosis, or edema MS- no significant deformity or atrophy Skin- no rash or lesion Psych- euthymic mood, full affect Neuro- strength and sensation are intact  Pacemaker interrogation today is reviewed and reveals afib burden to be 77%  Assessment and Plan:

## 2011-10-16 NOTE — Assessment & Plan Note (Signed)
Normal pacemaker function See Pace Art report No changes today  

## 2011-10-16 NOTE — Assessment & Plan Note (Signed)
He will require lifelong anticoagulation

## 2011-10-16 NOTE — Patient Instructions (Signed)
Your physician has recommended that you have an ablation. Catheter ablation is a medical procedure used to treat some cardiac arrhythmias (irregular heartbeats). During catheter ablation, a long, thin, flexible tube is put into a blood vessel in your groin (upper thigh), or neck. This tube is called an ablation catheter. It is then guided to your heart through the blood vessel. Radio frequency waves destroy small areas of heart tissue where abnormal heartbeats may cause an arrhythmia to start. Please see the instruction sheet given to you today.---WILL SCHEDULE FOR 11/28/11 AT 7:30AM  WILL NEED TO BE AT THE HOSPITAL AT 5:30AM  TEE- ---on 11/27/11  I will call you with the time of procedure  Your physician recommends that you return for lab work in: 11/21/11  You do not have to be fasting for these labs

## 2011-10-16 NOTE — Assessment & Plan Note (Signed)
Importance of compliance with CPAP was stressed today 

## 2011-10-16 NOTE — Assessment & Plan Note (Signed)
The patient has symptomatic atrial fibrillation.  Though his afib has previously been paroxysmal, I am concerned that he is becoming persistent with time.  His LA is at least moderately dilated.  He has failed medical therapy with flecainide and rhythmol.  Therapeutic strategies for afib including medicine and ablation were discussed in detail with the patient today. Risk, benefits, and alternatives to EP study and radiofrequency ablation for afib were also discussed in detail today. These risks include but are not limited to stroke, bleeding, vascular damage, tamponade, perforation, damage to the esophagus, lungs, and other structures, pulmonary vein stenosis, worsening renal function, pacemaker lead dislodgement, and death. The patient understands these risk and wishes to proceed.  We will therefore proceed with catheter ablation at the next available time. Metoprolol is restarted today.

## 2011-11-06 ENCOUNTER — Other Ambulatory Visit: Payer: Self-pay | Admitting: Physician Assistant

## 2011-11-14 ENCOUNTER — Telehealth: Payer: Self-pay | Admitting: Internal Medicine

## 2011-11-14 ENCOUNTER — Ambulatory Visit (INDEPENDENT_AMBULATORY_CARE_PROVIDER_SITE_OTHER): Payer: Medicare Other | Admitting: Physician Assistant

## 2011-11-14 ENCOUNTER — Encounter: Payer: Self-pay | Admitting: Physician Assistant

## 2011-11-14 VITALS — BP 120/79 | HR 69 | Temp 97.8°F | Resp 16 | Ht 69.0 in | Wt 212.2 lb

## 2011-11-14 DIAGNOSIS — Z0000 Encounter for general adult medical examination without abnormal findings: Secondary | ICD-10-CM

## 2011-11-14 DIAGNOSIS — Z139 Encounter for screening, unspecified: Secondary | ICD-10-CM

## 2011-11-14 DIAGNOSIS — E782 Mixed hyperlipidemia: Secondary | ICD-10-CM

## 2011-11-14 DIAGNOSIS — Z125 Encounter for screening for malignant neoplasm of prostate: Secondary | ICD-10-CM

## 2011-11-14 LAB — LIPID PANEL
Cholesterol: 249 mg/dL — ABNORMAL HIGH (ref 0–200)
Total CHOL/HDL Ratio: 6.6 Ratio
Triglycerides: 526 mg/dL — ABNORMAL HIGH (ref <150)

## 2011-11-14 LAB — COMPREHENSIVE METABOLIC PANEL
Albumin: 4.6 g/dL (ref 3.5–5.2)
Alkaline Phosphatase: 47 U/L (ref 39–117)
BUN: 22 mg/dL (ref 6–23)
Calcium: 9.7 mg/dL (ref 8.4–10.5)
Creat: 1.33 mg/dL (ref 0.50–1.35)
Glucose, Bld: 89 mg/dL (ref 70–99)
Potassium: 4.5 mEq/L (ref 3.5–5.3)

## 2011-11-14 LAB — POCT URINALYSIS DIPSTICK
Bilirubin, UA: NEGATIVE
Blood, UA: NEGATIVE
Ketones, UA: NEGATIVE
Protein, UA: NEGATIVE
Spec Grav, UA: >=1.03
pH, UA: 5.5

## 2011-11-14 LAB — CBC WITH DIFFERENTIAL/PLATELET
Basophils Relative: 1 % (ref 0–1)
Eosinophils Relative: 4 % (ref 0–5)
HCT: 43 % (ref 39.0–52.0)
Hemoglobin: 14.8 g/dL (ref 13.0–17.0)
MCH: 32 pg (ref 26.0–34.0)
MCHC: 34.4 g/dL (ref 30.0–36.0)
MCV: 93.1 fL (ref 78.0–100.0)
Monocytes Absolute: 0.5 10*3/uL (ref 0.1–1.0)
Monocytes Relative: 9 % (ref 3–12)
Neutro Abs: 3.4 10*3/uL (ref 1.7–7.7)
RDW: 13.3 % (ref 11.5–15.5)

## 2011-11-14 LAB — POCT UA - MICROSCOPIC ONLY
Crystals, Ur, HPF, POC: NEGATIVE
Mucus, UA: NEGATIVE

## 2011-11-14 NOTE — Progress Notes (Signed)
Subjective:    Patient ID: [name], male    DOB: Jul 08, 1946, 66 y.o.   MRN: [id number]  HPI [name] presents today for his CPE. He has no no medical issues to add to his problem list. He notes that he is scheduled for cardiac ablation for his AFIB on May 21.  He does experience tiredness from his afib when he does moderate activity such as gardening. He is taking all of his medications as directed.   He tolerates his CPAP well and uses it nightly.   Requip is helping with his restless leg syndrome.  He sleeps well.  He has previous history of stroke and has a mild left peripheral visual field deficit.  He is able to drive.  He denies any changes in vision, double vision, blurry vison.  He does have occasional floaters and has mild cataracts.  He rarely has headaches,  He denies any symptoms such as weakness or loss of speech.    Allergy symptoms are well controlled on nasonex.  Patient denies itchy , watery eyes or post nasal drip. Does  Have occasional "wheezing" that seems to clear with cough. GERD symptoms are well controlled. Occasional gas and belching due to hiatal hernia. Denies nausea, vomiting. Has regular bowel habit (twice daily) without any changes. Review of Systems  Constitutional: Negative.   HENT: Positive for hearing loss (uses hearing aids, has lost hearing more on the left sides) and tinnitus. Negative for ear pain, nosebleeds, rhinorrhea, sneezing, postnasal drip and ear discharge. Congestion: seasonal allergies.   Eyes: Negative for photophobia, pain, discharge, redness and itching. Visual disturbance: left perpheral field deficit from stroke.  Respiratory: Negative for apnea, cough, choking, chest tightness, shortness of breath and stridor. Wheezing: wife has notied episodes with allergy season, often clears with cough.   Cardiovascular: Negative for chest pain, palpitations and leg swelling.  Gastrointestinal: Negative for abdominal distention.  Genitourinary:  Negative.   Musculoskeletal: Positive for back pain (arthritic pain in hands, toes, and low back, does not affect ADLs). Negative for myalgias, joint swelling, arthralgias and gait problem.  Neurological: Positive for tremors (essential tremor right hand), numbness (comes and goes, left hand with gripping for long periods) and headaches (rare, 1 every 2 months). Negative for dizziness, seizures, syncope, facial asymmetry, speech difficulty, weakness and light-headedness.  Hematological: Negative for adenopathy. Does not bruise/bleed easily.  Psychiatric/Behavioral: Negative.        Objective:   Physical Exam  Constitutional: He appears well-developed and well-nourished. No distress.  HENT:  Head: Normocephalic and atraumatic.  Right Ear: External ear normal.  Left Ear: External ear normal.  Nose: Nose normal.  Mouth/Throat: Oropharynx is clear and moist. No oropharyngeal exudate.       TMs clear Good dentition   Eyes: Conjunctivae and EOM are normal. Pupils are equal, round, and reactive to light. Right eye exhibits no discharge. Left eye exhibits no discharge. No scleral icterus.  Fundoscopic exam:      The right eye shows red reflex.      The left eye shows red reflex. Neck: Carotid bruit is not present. No mass and no thyromegaly present.  Cardiovascular: Normal heart sounds.  An irregularly irregular rhythm present.  Pulmonary/Chest: Effort normal and breath sounds normal. No stridor.  Abdominal: Soft. Normal appearance and bowel sounds are normal. He exhibits no abdominal bruit and no mass. There is no tenderness.  Skin: He is not diaphoretic.   Filed Vitals:   11/14/11 0916  BP: 120/79  Pulse: 69  Temp: 97.8 F (36.6 C)  Resp: 16   Results for orders placed in visit on 11/14/11  IFOBT (OCCULT BLOOD)      Component Value Range   IFOBT Negative    POCT UA - MICROSCOPIC ONLY      Component Value Range   WBC, Ur, HPF, POC 0-2     RBC, urine, microscopic 0-1      Bacteria, U Microscopic neg     Mucus, UA neg     Epithelial cells, urine per micros 0-1     Crystals, Ur, HPF, POC neg     Casts, Ur, LPF, POC neg     Yeast, UA neg    POCT URINALYSIS DIPSTICK      Component Value Range   Color, UA amber     Clarity, UA clear     Glucose, UA neg     Bilirubin, UA neg     Ketones, UA neg     Spec Grav, UA >=1.030     Blood, UA neg     pH, UA 5.5     Protein, UA neg     Urobilinogen, UA 0.2     Nitrite, UA neg     Leukocytes, UA Negative            Assessment & Plan:   1. Routine general medical examination at a health care facility  IFOBT POC (occult bld, rslt in office), POCT UA - Microscopic Only, POCT urinalysis dipstick, CBC with Differential, Comprehensive metabolic panel  2. Screening for unspecified condition    3. Special screening for malignant neoplasm of prostate  PSA, Medicare  4. Mixed hyperlipidemia  TSH, Lipid panel   RTC 6 months

## 2011-11-14 NOTE — Telephone Encounter (Signed)
Pt would like to know the time his TEE is scheduled for

## 2011-11-14 NOTE — Telephone Encounter (Signed)
TEE is scheduled for 10am on 11/27/11 He will be there at 9am   Nothing to eat or drink after midnight and report to Short Stay  Patient aware and will pick up instruction sheet on 5/14

## 2011-11-14 NOTE — Progress Notes (Signed)
I have examined this patient along with the student and agree.  

## 2011-11-14 NOTE — Patient Instructions (Signed)

## 2011-11-15 ENCOUNTER — Encounter: Payer: Self-pay | Admitting: *Deleted

## 2011-11-15 LAB — TSH: TSH: 3.812 u[IU]/mL (ref 0.350–4.500)

## 2011-11-16 ENCOUNTER — Encounter: Payer: Self-pay | Admitting: Internal Medicine

## 2011-11-16 ENCOUNTER — Ambulatory Visit (INDEPENDENT_AMBULATORY_CARE_PROVIDER_SITE_OTHER): Payer: Medicare Other | Admitting: Internal Medicine

## 2011-11-16 VITALS — BP 110/78 | HR 74 | Ht 70.0 in | Wt 214.6 lb

## 2011-11-16 DIAGNOSIS — J309 Allergic rhinitis, unspecified: Secondary | ICD-10-CM

## 2011-11-16 DIAGNOSIS — J302 Other seasonal allergic rhinitis: Secondary | ICD-10-CM

## 2011-11-16 DIAGNOSIS — G4733 Obstructive sleep apnea (adult) (pediatric): Secondary | ICD-10-CM

## 2011-11-16 DIAGNOSIS — J45909 Unspecified asthma, uncomplicated: Secondary | ICD-10-CM

## 2011-11-16 MED ORDER — MOMETASONE FUROATE 50 MCG/ACT NA SUSP: NASAL | Status: DC

## 2011-11-16 MED ORDER — ALBUTEROL SULFATE HFA 108 (90 BASE) MCG/ACT IN AERS: 2.0000 | INHALATION_SPRAY | RESPIRATORY_TRACT | Status: DC | PRN

## 2011-11-16 NOTE — Patient Instructions (Signed)
Script to refill albuterol rescue inhaler sent  Script to refill Nasonex sent  Continue CPAP 8   Please call as needed

## 2011-11-16 NOTE — Progress Notes (Signed)
  Subjective:    Patient ID: [name], male    DOB: 1946/06/03, 65 y.o.   MRN: [id number]  HPI 11/16/10-65 yoM with OSA and asthma, complicated by atrial fib/ pacemaker and hx CVA.  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 Since then he got through the winter well.  He denies dyspnea. A few wheeze-like sensations do not respond to rescue inhaler. Admits some nasal congestion. He used his Nasonex for a few days. CPAP continues all night every night at 7/ [facility]. He has been tempted to try a bit higher.   11/16/11- 65 yoM with OSA and asthma, complicated by atrial fib/ pacemaker and hx CVA.  Scuba diver Stomach sleeper but able to readjust to be able to use CPAP every night Not diving much until weather warms up. Pending ablation/Dr Alred Using CPAP all night every night 8/[facility]. Wife, and this wheeze in the mornings. He doesn't notice  ROS-see HPI Constitutional:   No-   weight loss, night sweats, fevers, chills, fatigue, lassitude. HEENT:   No-  headaches, difficulty swallowing, tooth/dental problems, sore throat,       No-  sneezing, itching, ear ache, nasal congestion, post nasal drip,  CV:  No-   chest pain, orthopnea, PND, swelling in lower extremities, anasarca, dizziness, palpitations Resp: No-   shortness of breath with exertion or at rest.              No-   productive cough,  No non-productive cough,  No- coughing up of blood.              No-   change in color of mucus.  + wheezing.   Skin: No-   rash or lesions. GI:  No-   heartburn, indigestion, abdominal pain, nausea, vomiting,  GU:  MS:  No-   joint pain or swelling.   Neuro-     nothing unusual Psych:  No- change in mood or affect. No depression or anxiety.  No memory loss.  OBJ- Physical Exam General- Alert, Oriented, Affect-appropriate, Distress- none acute. Fit appearing. Skin- rash-none, lesions- none, excoriation- none Lymphadenopathy- none Head- atraumatic            Eyes- Gross  vision intact, PERRLA, conjunctivae and secretions clear            Ears- Hearing, canals-normal            Nose- Clear, no-Septal dev, mucus, polyps, erosion, perforation             Throat- Mallampati II , mucosa clear , drainage- none, tonsils- atrophic Neck- flexible , trachea midline, no stridor , thyroid nl, carotid no bruit Chest - symmetrical excursion , unlabored           Heart/CV- RRR/paced , no murmur , no gallop  , no rub, nl s1 s2                           - JVD- none , edema- none, stasis changes- none, varices- none           Lung- clear to P&A, wheeze- none, cough- none , dullness-none, rub- none           Chest wall- pacemaker Abd-  Br/ Gen/ Rectal- Not done, not indicated Extrem- cyanosis- none, clubbing, none, atrophy- none, strength- nl Neuro- grossly intact to observation    Assessment & Plan:

## 2011-11-17 ENCOUNTER — Other Ambulatory Visit: Payer: Self-pay | Admitting: *Deleted

## 2011-11-17 DIAGNOSIS — Z01810 Encounter for preprocedural cardiovascular examination: Secondary | ICD-10-CM

## 2011-11-17 DIAGNOSIS — I4891 Unspecified atrial fibrillation: Secondary | ICD-10-CM

## 2011-11-19 NOTE — Assessment & Plan Note (Signed)
We have discussed potential risks associated with scuba diving as an asthmatic. He has been careful. Plan-refill albuterol rescue inhaler

## 2011-11-19 NOTE — Assessment & Plan Note (Signed)
Good compliance and control 

## 2011-11-19 NOTE — Assessment & Plan Note (Signed)
He has not had significant problems while diving Plan-refill Nasonex

## 2011-11-21 ENCOUNTER — Other Ambulatory Visit (INDEPENDENT_AMBULATORY_CARE_PROVIDER_SITE_OTHER): Payer: Medicare Other

## 2011-11-21 DIAGNOSIS — I495 Sick sinus syndrome: Secondary | ICD-10-CM

## 2011-11-21 DIAGNOSIS — I4891 Unspecified atrial fibrillation: Secondary | ICD-10-CM

## 2011-11-21 LAB — BASIC METABOLIC PANEL
BUN: 23 mg/dL (ref 6–23)
Calcium: 9.6 mg/dL (ref 8.4–10.5)
Creatinine, Ser: 1.5 mg/dL (ref 0.4–1.5)

## 2011-11-21 LAB — CBC WITH DIFFERENTIAL/PLATELET
Basophils Absolute: 0 10*3/uL (ref 0.0–0.1)
Basophils Relative: 0.8 % (ref 0.0–3.0)
Eosinophils Absolute: 0.2 10*3/uL (ref 0.0–0.7)
HCT: 43.4 % (ref 39.0–52.0)
Hemoglobin: 14.7 g/dL (ref 13.0–17.0)
Lymphs Abs: 0.9 10*3/uL (ref 0.7–4.0)
MCHC: 33.8 g/dL (ref 30.0–36.0)
MCV: 94.4 fl (ref 78.0–100.0)
Neutro Abs: 3 10*3/uL (ref 1.4–7.7)
RBC: 4.6 Mil/uL (ref 4.22–5.81)
RDW: 13.4 % (ref 11.5–14.6)

## 2011-11-22 ENCOUNTER — Other Ambulatory Visit: Payer: Self-pay | Admitting: Internal Medicine

## 2011-11-27 ENCOUNTER — Encounter (HOSPITAL_COMMUNITY)
Admission: RE | Disposition: A | Discharge status: Home / Self Care | Payer: Self-pay | Source: Ambulatory Visit | Attending: Internal Medicine

## 2011-11-27 ENCOUNTER — Ambulatory Visit (HOSPITAL_COMMUNITY)
Admission: RE | Admit: 2011-11-27 | Discharge: 2011-11-27 | Disposition: A | Discharge status: Home / Self Care | Payer: Medicare Other | Source: Ambulatory Visit | Attending: Internal Medicine | Admitting: Internal Medicine

## 2011-11-27 ENCOUNTER — Ambulatory Visit (HOSPITAL_COMMUNITY): Admission: RE | Admit: 2011-11-27 | Payer: Medicare Other | Source: Ambulatory Visit | Admitting: Internal Medicine

## 2011-11-27 ENCOUNTER — Encounter (HOSPITAL_COMMUNITY): Payer: Self-pay | Admitting: Pharmacy Technician

## 2011-11-27 ENCOUNTER — Encounter (HOSPITAL_COMMUNITY): Payer: Self-pay

## 2011-11-27 DIAGNOSIS — G4733 Obstructive sleep apnea (adult) (pediatric): Secondary | ICD-10-CM | POA: Insufficient documentation

## 2011-11-27 DIAGNOSIS — J45909 Unspecified asthma, uncomplicated: Secondary | ICD-10-CM | POA: Insufficient documentation

## 2011-11-27 DIAGNOSIS — Z8673 Personal history of transient ischemic attack (TIA), and cerebral infarction without residual deficits: Secondary | ICD-10-CM | POA: Insufficient documentation

## 2011-11-27 DIAGNOSIS — Z950 Presence of cardiac pacemaker: Secondary | ICD-10-CM | POA: Insufficient documentation

## 2011-11-27 DIAGNOSIS — I4891 Unspecified atrial fibrillation: Secondary | ICD-10-CM

## 2011-11-27 DIAGNOSIS — I059 Rheumatic mitral valve disease, unspecified: Secondary | ICD-10-CM | POA: Insufficient documentation

## 2011-11-27 DIAGNOSIS — I079 Rheumatic tricuspid valve disease, unspecified: Secondary | ICD-10-CM | POA: Insufficient documentation

## 2011-11-27 HISTORY — PX: TEE WITHOUT CARDIOVERSION: SHX5443

## 2011-11-27 SURGERY — ECHOCARDIOGRAM, TRANSESOPHAGEAL
Anesthesia: Moderate Sedation

## 2011-11-27 MED ORDER — FENTANYL CITRATE 0.05 MG/ML IJ SOLN
INTRAMUSCULAR | Status: AC
  Filled 2011-11-27: qty 4

## 2011-11-27 MED ORDER — BENZOCAINE 20 % MT SOLN: 1.0000 "application " | OROMUCOSAL | Status: DC | PRN

## 2011-11-27 MED ORDER — LIDOCAINE VISCOUS 2 % MT SOLN
OROMUCOSAL | Status: AC
  Filled 2011-11-27: qty 15

## 2011-11-27 MED ORDER — MIDAZOLAM HCL 10 MG/2ML IJ SOLN
INTRAMUSCULAR | Status: DC | PRN
  Administered 2011-11-27 (×3): 2 mg via INTRAVENOUS

## 2011-11-27 MED ORDER — FENTANYL CITRATE 0.05 MG/ML IJ SOLN
INTRAMUSCULAR | Status: DC | PRN
  Administered 2011-11-27 (×2): 25 ug via INTRAVENOUS

## 2011-11-27 MED ORDER — SODIUM CHLORIDE 0.9 % IV SOLN: 250.0000 mL | INTRAVENOUS | Status: DC | PRN

## 2011-11-27 MED ORDER — MIDAZOLAM HCL 10 MG/2ML IJ SOLN
INTRAMUSCULAR | Status: AC
  Filled 2011-11-27: qty 4

## 2011-11-27 MED ORDER — DIPHENHYDRAMINE HCL 50 MG/ML IJ SOLN
INTRAMUSCULAR | Status: AC
  Filled 2011-11-27: qty 1

## 2011-11-27 MED ORDER — FENTANYL CITRATE 0.05 MG/ML IJ SOLN: 250.0000 ug | Freq: Once | INTRAMUSCULAR | Status: DC

## 2011-11-27 MED ORDER — MIDAZOLAM HCL 10 MG/2ML IJ SOLN: 10.0000 mg | Freq: Once | INTRAMUSCULAR | Status: DC

## 2011-11-27 MED ORDER — LIDOCAINE VISCOUS 2 % MT SOLN
OROMUCOSAL | Status: DC | PRN
  Administered 2011-11-27: 10 mL via OROMUCOSAL

## 2011-11-27 NOTE — Interval H&P Note (Signed)
History and Physical Interval Note:  11/27/2011 10:27 AM  [name]  has presented today for surgery, with the diagnosis of A-Fib  The various methods of treatment have been discussed with the patient and family. After consideration of risks, benefits and other options for treatment, the patient has consented to  Procedure(s) (LRB): TRANSESOPHAGEAL ECHOCARDIOGRAM (TEE) (N/A) as a surgical intervention .  The patients' history has been reviewed, patient examined, no change in status, stable for surgery.  I have reviewed the patients' chart and labs.  Questions were answered to the patient's satisfaction.     [name]  Patient seen and examined.  Patient also followed by J Allred.  History of atrial fibrillation.  For TEE preablation.  Overall no contraindication to above procedure.

## 2011-11-27 NOTE — Discharge Instructions (Addendum)
Endoscopy Care After Please read the instructions outlined below and refer to this sheet in the next few weeks. These discharge instructions provide you with general information on caring for yourself after you leave the hospital. Your doctor may also give you specific instructions. While your treatment has been planned according to the most current medical practices available, unavoidable complications occasionally occur. If you have any problems or questions after discharge, please call your doctor. HOME CARE INSTRUCTIONS Activity  You may resume your regular activity but move at a slower pace for the next 24 hours.   Take frequent rest periods for the next 24 hours.   Walking will help expel (get rid of) the air and reduce the bloated feeling in your abdomen.   No driving for 24 hours (because of the anesthesia (medicine) used during the test).   You may shower.   Do not sign any important legal documents or operate any machinery for 24 hours (because of the anesthesia used during the test).  Nutrition  Drink plenty of fluids.   You may resume your normal diet.   Begin with a light meal and progress to your normal diet.   Avoid alcoholic beverages for 24 hours or as instructed by your caregiver.  Medications You may resume your normal medications unless your caregiver tells you otherwise. What you can expect today  You may experience abdominal discomfort such as a feeling of fullness or "gas" pains.   You may experience a sore throat for 2 to 3 days. This is normal. Gargling with salt water may help this.  Follow-up Your doctor will discuss the results of your test with you. SEEK IMMEDIATE MEDICAL CARE IF:  You have excessive nausea (feeling sick to your stomach) and/or vomiting.   You have severe abdominal pain and distention (swelling).   You have trouble swallowing.   You have a temperature over 100 F (37.8 C).   You have rectal bleeding or vomiting of blood.    Document Released: 02/08/2004 Document Revised: 06/15/2011 Document Reviewed: 08/21/2007 [employer] Patient Information 2012 [employer]. 

## 2011-11-27 NOTE — Op Note (Signed)
Full report to follow 

## 2011-11-27 NOTE — Progress Notes (Signed)
  Echocardiogram Echocardiogram Transesophageal has been performed.  [name], Real Cons 11/27/2011, 11:07 AM

## 2011-11-27 NOTE — H&P (View-Only) (Signed)
  Subjective:    Patient ID: [name], male    DOB: 09/08/1945, 65 y.o.   MRN: [id number]  HPI 11/16/10-65 yoM with OSA and asthma, complicated by atrial fib/ pacemaker and hx CVA.  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 Since then he got through the winter well.  He denies dyspnea. A few wheeze-like sensations do not respond to rescue inhaler. Admits some nasal congestion. He used his Nasonex for a few days. CPAP continues all night every night at 7/ [facility]. He has been tempted to try a bit higher.   11/16/11- 65 yoM with OSA and asthma, complicated by atrial fib/ pacemaker and hx CVA.  Scuba diver Stomach sleeper but able to readjust to be able to use CPAP every night Not diving much until weather warms up. Pending ablation/Dr Alred Using CPAP all night every night 8/[facility]. Wife, and this wheeze in the mornings. He doesn't notice  ROS-see HPI Constitutional:   No-   weight loss, night sweats, fevers, chills, fatigue, lassitude. HEENT:   No-  headaches, difficulty swallowing, tooth/dental problems, sore throat,       No-  sneezing, itching, ear ache, nasal congestion, post nasal drip,  CV:  No-   chest pain, orthopnea, PND, swelling in lower extremities, anasarca, dizziness, palpitations Resp: No-   shortness of breath with exertion or at rest.              No-   productive cough,  No non-productive cough,  No- coughing up of blood.              No-   change in color of mucus.  + wheezing.   Skin: No-   rash or lesions. GI:  No-   heartburn, indigestion, abdominal pain, nausea, vomiting,  GU:  MS:  No-   joint pain or swelling.   Neuro-     nothing unusual Psych:  No- change in mood or affect. No depression or anxiety.  No memory loss.  OBJ- Physical Exam General- Alert, Oriented, Affect-appropriate, Distress- none acute. Fit appearing. Skin- rash-none, lesions- none, excoriation- none Lymphadenopathy- none Head- atraumatic            Eyes- Gross  vision intact, PERRLA, conjunctivae and secretions clear            Ears- Hearing, canals-normal            Nose- Clear, no-Septal dev, mucus, polyps, erosion, perforation             Throat- Mallampati II , mucosa clear , drainage- none, tonsils- atrophic Neck- flexible , trachea midline, no stridor , thyroid nl, carotid no bruit Chest - symmetrical excursion , unlabored           Heart/CV- RRR/paced , no murmur , no gallop  , no rub, nl s1 s2                           - JVD- none , edema- none, stasis changes- none, varices- none           Lung- clear to P&A, wheeze- none, cough- none , dullness-none, rub- none           Chest wall- pacemaker Abd-  Br/ Gen/ Rectal- Not done, not indicated Extrem- cyanosis- none, clubbing, none, atrophy- none, strength- nl Neuro- grossly intact to observation    Assessment & Plan:   

## 2011-11-28 ENCOUNTER — Ambulatory Visit (HOSPITAL_COMMUNITY): Admit: 2011-11-28 | Payer: Self-pay | Admitting: Internal Medicine

## 2011-11-28 ENCOUNTER — Encounter (HOSPITAL_COMMUNITY): Payer: Self-pay | Admitting: Certified Registered"

## 2011-11-28 ENCOUNTER — Ambulatory Visit (HOSPITAL_COMMUNITY): Payer: Medicare Other | Admitting: Certified Registered"

## 2011-11-28 ENCOUNTER — Encounter (HOSPITAL_COMMUNITY): Payer: Self-pay

## 2011-11-28 ENCOUNTER — Encounter (HOSPITAL_COMMUNITY)
Admission: RE | Disposition: A | Discharge status: Home / Self Care | Payer: Self-pay | Source: Ambulatory Visit | Attending: Internal Medicine

## 2011-11-28 ENCOUNTER — Ambulatory Visit (HOSPITAL_COMMUNITY)
Admission: RE | Admit: 2011-11-28 | Discharge: 2011-11-29 | Disposition: A | Discharge status: Home / Self Care | Payer: Medicare Other | Source: Ambulatory Visit | Attending: Internal Medicine | Admitting: Internal Medicine

## 2011-11-28 DIAGNOSIS — I10 Essential (primary) hypertension: Secondary | ICD-10-CM | POA: Insufficient documentation

## 2011-11-28 DIAGNOSIS — Z950 Presence of cardiac pacemaker: Secondary | ICD-10-CM | POA: Insufficient documentation

## 2011-11-28 DIAGNOSIS — Z01810 Encounter for preprocedural cardiovascular examination: Secondary | ICD-10-CM

## 2011-11-28 DIAGNOSIS — I4891 Unspecified atrial fibrillation: Secondary | ICD-10-CM | POA: Insufficient documentation

## 2011-11-28 DIAGNOSIS — K219 Gastro-esophageal reflux disease without esophagitis: Secondary | ICD-10-CM | POA: Insufficient documentation

## 2011-11-28 DIAGNOSIS — G4733 Obstructive sleep apnea (adult) (pediatric): Secondary | ICD-10-CM | POA: Insufficient documentation

## 2011-11-28 DIAGNOSIS — I6789 Other cerebrovascular disease: Secondary | ICD-10-CM

## 2011-11-28 DIAGNOSIS — E785 Hyperlipidemia, unspecified: Secondary | ICD-10-CM | POA: Insufficient documentation

## 2011-11-28 HISTORY — PX: ATRIAL FIBRILLATION ABLATION: SHX5456

## 2011-11-28 LAB — POCT ACTIVATED CLOTTING TIME
Activated Clotting Time: 320 seconds
Activated Clotting Time: 298 seconds

## 2011-11-28 LAB — MRSA PCR SCREENING: MRSA by PCR: NEGATIVE

## 2011-11-28 SURGERY — ATRIAL FIBRILLATION ABLATION
Anesthesia: General

## 2011-11-28 SURGERY — ATRIAL FIBRILLATION ABLATION
Anesthesia: Monitor Anesthesia Care

## 2011-11-28 MED ORDER — ALBUTEROL SULFATE HFA 108 (90 BASE) MCG/ACT IN AERS
2.0000 | INHALATION_SPRAY | RESPIRATORY_TRACT | Status: DC | PRN
  Filled 2011-11-28: qty 6.7

## 2011-11-28 MED ORDER — HYDROMORPHONE HCL PF 1 MG/ML IJ SOLN: 0.2500 mg | INTRAMUSCULAR | Status: DC | PRN

## 2011-11-28 MED ORDER — SODIUM CHLORIDE 0.9 % IJ SOLN: 3.0000 mL | INTRAMUSCULAR | Status: DC | PRN

## 2011-11-28 MED ORDER — ONDANSETRON HCL 4 MG/2ML IJ SOLN: 4.0000 mg | Freq: Four times a day (QID) | INTRAMUSCULAR | Status: DC | PRN

## 2011-11-28 MED ORDER — LOSARTAN POTASSIUM 50 MG PO TABS
50.0000 mg | ORAL_TABLET | Freq: Every day | ORAL | Status: DC
  Administered 2011-11-28 – 2011-11-29 (×2): 50 mg via ORAL
  Filled 2011-11-28 (×2): qty 1

## 2011-11-28 MED ORDER — FLUVOXAMINE MALEATE 100 MG PO TABS
100.0000 mg | ORAL_TABLET | Freq: Every day | ORAL | Status: DC
  Filled 2011-11-28 (×2): qty 1

## 2011-11-28 MED ORDER — PANTOPRAZOLE SODIUM 40 MG PO TBEC
40.0000 mg | DELAYED_RELEASE_TABLET | Freq: Every day | ORAL | Status: DC
  Filled 2011-11-28: qty 1

## 2011-11-28 MED ORDER — SODIUM CHLORIDE 0.9 % IV SOLN: 250.0000 mL | INTRAVENOUS | Status: DC | PRN

## 2011-11-28 MED ORDER — MORPHINE SULFATE 4 MG/ML IJ SOLN: 0.0500 mg/kg | INTRAMUSCULAR | Status: DC | PRN

## 2011-11-28 MED ORDER — LACTATED RINGERS IV SOLN
INTRAVENOUS | Status: DC | PRN
  Administered 2011-11-28: 07:00:00 via INTRAVENOUS

## 2011-11-28 MED ORDER — BUPIVACAINE HCL (PF) 0.25 % IJ SOLN
INTRAMUSCULAR | Status: AC
  Filled 2011-11-28: qty 30

## 2011-11-28 MED ORDER — SODIUM CHLORIDE 0.9 % IV SOLN
INTRAVENOUS | Status: DC | PRN
  Administered 2011-11-28 (×3): via INTRAVENOUS

## 2011-11-28 MED ORDER — FENTANYL CITRATE 0.05 MG/ML IJ SOLN
INTRAMUSCULAR | Status: DC | PRN
  Administered 2011-11-28: 25 ug via INTRAVENOUS
  Administered 2011-11-28: 100 ug via INTRAVENOUS
  Administered 2011-11-28: 25 ug via INTRAVENOUS

## 2011-11-28 MED ORDER — SODIUM CHLORIDE 0.9 % IJ SOLN: 3.0000 mL | Freq: Two times a day (BID) | INTRAMUSCULAR | Status: DC

## 2011-11-28 MED ORDER — FENTANYL CITRATE 0.05 MG/ML IJ SOLN: 250.0000 ug | Freq: Once | INTRAMUSCULAR | Status: DC

## 2011-11-28 MED ORDER — BENZOCAINE 20 % MT SOLN: 1.0000 "application " | OROMUCOSAL | Status: DC | PRN

## 2011-11-28 MED ORDER — ACETAMINOPHEN 325 MG PO TABS: 650.0000 mg | ORAL_TABLET | ORAL | Status: DC | PRN

## 2011-11-28 MED ORDER — HYDROCODONE-ACETAMINOPHEN 5-325 MG PO TABS
1.0000 | ORAL_TABLET | ORAL | Status: DC | PRN
  Administered 2011-11-28: 1 via ORAL
  Filled 2011-11-28: qty 1

## 2011-11-28 MED ORDER — PROPOFOL 10 MG/ML IV EMUL
INTRAVENOUS | Status: DC | PRN
  Administered 2011-11-28: 25 ug/kg/min via INTRAVENOUS

## 2011-11-28 MED ORDER — COLESEVELAM HCL 625 MG PO TABS
1875.0000 mg | ORAL_TABLET | Freq: Two times a day (BID) | ORAL | Status: DC
  Administered 2011-11-28 – 2011-11-29 (×2): 1875 mg via ORAL
  Filled 2011-11-28 (×4): qty 3

## 2011-11-28 MED ORDER — EZETIMIBE 10 MG PO TABS
10.0000 mg | ORAL_TABLET | Freq: Every day | ORAL | Status: DC
  Administered 2011-11-28 – 2011-11-29 (×2): 10 mg via ORAL
  Filled 2011-11-28 (×2): qty 1

## 2011-11-28 MED ORDER — HEPARIN SODIUM (PORCINE) 1000 UNIT/ML IJ SOLN
INTRAMUSCULAR | Status: AC
  Filled 2011-11-28: qty 1

## 2011-11-28 MED ORDER — ROPINIROLE HCL 1 MG PO TABS
2.0000 mg | ORAL_TABLET | Freq: Every day | ORAL | Status: DC
  Administered 2011-11-28: 2 mg via ORAL
  Filled 2011-11-28 (×2): qty 2

## 2011-11-28 MED ORDER — MIDAZOLAM HCL 5 MG/5ML IJ SOLN
INTRAMUSCULAR | Status: DC | PRN
  Administered 2011-11-28: 2 mg via INTRAVENOUS

## 2011-11-28 MED ORDER — LACTATED RINGERS IV SOLN: INTRAVENOUS | Status: DC

## 2011-11-28 MED ORDER — PROPOFOL 10 MG/ML IV EMUL
INTRAVENOUS | Status: DC | PRN
  Administered 2011-11-28: 200 mg via INTRAVENOUS

## 2011-11-28 MED ORDER — PROTAMINE SULFATE 10 MG/ML IV SOLN
INTRAVENOUS | Status: DC | PRN
  Administered 2011-11-28: 30 mg via INTRAVENOUS

## 2011-11-28 MED ORDER — SODIUM CHLORIDE 0.9 % IJ SOLN
3.0000 mL | Freq: Two times a day (BID) | INTRAMUSCULAR | Status: DC
  Administered 2011-11-28: 3 mL via INTRAVENOUS

## 2011-11-28 MED ORDER — HEPARIN SODIUM (PORCINE) 1000 UNIT/ML IJ SOLN
INTRAMUSCULAR | Status: DC | PRN
  Administered 2011-11-28: 4000 [IU] via INTRAVENOUS
  Administered 2011-11-28: 2000 [IU] via INTRAVENOUS
  Administered 2011-11-28: 10000 [IU] via INTRAVENOUS

## 2011-11-28 MED ORDER — DABIGATRAN ETEXILATE MESYLATE 150 MG PO CAPS
150.0000 mg | ORAL_CAPSULE | Freq: Two times a day (BID) | ORAL | Status: DC
  Administered 2011-11-28 – 2011-11-29 (×2): 150 mg via ORAL
  Filled 2011-11-28 (×3): qty 1

## 2011-11-28 MED ORDER — SODIUM CHLORIDE 0.9 % IV SOLN: INTRAVENOUS | Status: DC

## 2011-11-28 MED ORDER — SODIUM CHLORIDE 0.45 % IV SOLN
INTRAVENOUS | Status: DC
  Administered 2011-11-28: 06:00:00 via INTRAVENOUS

## 2011-11-28 MED ORDER — ONDANSETRON HCL 4 MG/2ML IJ SOLN
INTRAMUSCULAR | Status: DC | PRN
  Administered 2011-11-28: 4 mg via INTRAVENOUS

## 2011-11-28 MED ORDER — MIDAZOLAM HCL 10 MG/2ML IJ SOLN: 10.0000 mg | Freq: Once | INTRAMUSCULAR | Status: DC

## 2011-11-28 NOTE — Anesthesia Procedure Notes (Addendum)
Date/Time: 11/28/2011 8:06 AM Performed by: Ellin Goodie   Procedure Name: LMA Insertion Date/Time: 11/28/2011 7:53 AM Performed by: Ellin Goodie Pre-anesthesia Checklist: Patient identified, Emergency Drugs available, Suction available, Patient being monitored and Timeout performed Patient Re-evaluated:Patient Re-evaluated prior to inductionOxygen Delivery Method: Circle system utilized Preoxygenation: Pre-oxygenation with 100% oxygen Intubation Type: IV induction Ventilation: Mask ventilation without difficulty LMA: LMA inserted LMA Size: 5.0 Number of attempts: 1 Tube secured with: Tape Dental Injury: Teeth and Oropharynx as per pre-operative assessment

## 2011-11-28 NOTE — Anesthesia Postprocedure Evaluation (Signed)
  Anesthesia Post-op Note  Patient: [name]  Procedure(s) Performed: Procedure(s) (LRB): ATRIAL FIBRILLATION ABLATION (N/A)  Patient Location: PACU  Anesthesia Type: General  Level of Consciousness: awake  Airway and Oxygen Therapy: Patient Spontanous Breathing  Post-op Pain: mild  Post-op Assessment: Post-op Vital signs reviewed  Post-op Vital Signs: Reviewed  Complications: No apparent anesthesia complications

## 2011-11-28 NOTE — Addendum Note (Signed)
Addendum  created 11/28/11 1342 by Hayley Horn M Weaver, CRNA   Modules edited:Anesthesia Flowsheet, Anesthesia Medication Administration    

## 2011-11-28 NOTE — H&P (Signed)
Primary Care Physician: Calvin Edin, MD, MD  Referring Physician: Dr Beverely Pace is a 66 y.o. male with a h/o paroxysmal atrial fibrillation who presents today for afib ablation. He reports initially being diagnosed with atrial fibrillation in 1998 after presenting with tachypalpitations to a hospital in [place]. He was placed on diltiazem. He had increasing frequency and duration of afib thereafter. He presented with an acute embolic stroke 12/2006. This was felt to be due to afib and he was therefore placed on coumadin. He was subsequently evaluated by Dr Graciela Husbands. He was found to have post termination pauses with his afib and therefore underwent pacemaker implantation 11/15/07. He has been treated with flecainide but continued to have increasing afib burden. He subsequently has had propafenone added to his flecainide. Despite this, his afib burden continues to increase. The patient has been treated with CPAP for OSA. He has not found any other triggers for his afib. He reports fatigue and decreased exercise tolerance with his afib. He reports mild SOB with moderate activity. Recent echo reveals preserved EF with LA size of 46mm. His coumadin has been transitioned to pradaxa. He now presents for consideration of afib ablation.   Past Medical History   Diagnosis  Date   .  Stroke  2008   .  Atrial fibrillation    .  OSA (obstructive sleep apnea)      on CPAP   .  Allergic rhinitis    .  Hyperlipidemia    .  Asthma    .  GERD (gastroesophageal reflux disease)    .  Barrett's esophagus  03/1993   .  RLS (restless legs syndrome)    .  Tubular adenoma polyp of rectum  07/1998   .  Hiatal hernia    .  Hemorrhoids    .  AVM (arteriovenous malformation)    .  Cataract    .  Hypertension    .  Sinoatrial node dysfunction      s/p PPM    Past Surgical History   Procedure  Date   .  Cholecystectomy open    .  Knee arthroscopy    .  Tonsillectomy    .  Pacemaker insertion  11/15/07       [employer] Jude Dr. Dannielle Karvonen implanted by Dr Graciela Husbands    Current Outpatient Prescriptions   Medication  Sig  Dispense  Refill   .  cholecalciferol (VITAMIN D) 1000 UNITS tablet  Take 1,000 Units by mouth daily.     .  colesevelam (WELCHOL) 625 MG tablet  Take 1,875 mg by mouth 2 (two) times daily with a meal.     .  dabigatran (PRADAXA) 150 MG CAPS  Take 150 mg by mouth every 12 (twelve) hours.     Marland Kitchen  diltiazem (CARDIZEM LA) 180 MG 24 hr tablet  Take one tablet by mouth twice daily.     Marland Kitchen  ezetimibe (ZETIA) 10 MG tablet  Take 10 mg by mouth daily.     .  fenofibrate 160 MG tablet  Take 160 mg by mouth daily.     .  fluvoxaMINE (LUVOX) 100 MG tablet  Take 100 mg by mouth at bedtime.     Marland Kitchen  glucosamine-chondroitin 500-400 MG tablet  Take 1 tablet by mouth 2 (two) times daily.     Marland Kitchen  losartan (COZAAR) 50 MG tablet  Take 50 mg by mouth daily.     Marland Kitchen  mometasone (NASONEX) 50 MCG/ACT nasal spray  Place 2 sprays into the nose daily as needed. For nasal congestion     .  Multiple Vitamin (MULITIVITAMIN WITH MINERALS) TABS  Take 1 tablet by mouth daily.     .  Omega-3 Fatty Acids (FISH OIL) 1000 MG CAPS  Take 1 capsule by mouth 2 (two) times daily.     Marland Kitchen  omeprazole (PRILOSEC) 40 MG capsule  Take 40 mg by mouth daily.     Marland Kitchen  omeprazole (PRILOSEC) 40 MG capsule  TAKE ONE CAPSULE BY MOUTH EVERY DAY  30 capsule  5   .  PRADAXA 150 MG CAPS  TAKE 1 CAPSULE (150 MG TOTAL) BY MOUTH EVERY 12 (TWELVE) HOURS.  60 capsule  6   .  propafenone (RYTHMOL SR) 225 MG 12 hr capsule  Take 225 mg by mouth 2 (two) times daily.     Marland Kitchen  rOPINIRole (REQUIP) 2 MG tablet  Take 2 mg by mouth at bedtime.     .  Testosterone (ANDROGEL) 25 MG/2.5GM GEL  Use as directed     .  metoprolol tartrate (LOPRESSOR) 25 MG tablet  Take 25 mg by mouth 2 (two) times daily.      Current Facility-Administered Medications   Medication  Dose  Route  Frequency  Provider  Last Rate  Last Dose   .  0.9 % sodium chloride infusion  500 mL  Intravenous   Continuous  Meryl Dare, MD,FACG      Allergies   Allergen  Reactions   .  Niacin  Itching   .  Statins      Liver damage    History    Social History   .  Marital Status:  Married     Spouse Name:  N/A     Number of Children:  1   .  Years of Education:  N/A    Occupational History   .  Geophysicist/field seismologist    .      Social History Main Topics   .  Smoking status:  Never Smoker   .  Smokeless tobacco:  Never Used   .  Alcohol Use:  Yes      social   .  Drug Use:  No   .  Sexually Active:  Not on file    Other Topics  Concern   .  Not on file    Social History Narrative    Pt lives in [place]. 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 Enjoys scuba diving.    Family History   Problem  Relation  Age of Onset   .  Sleep apnea  Brother    .  Stroke  Mother    .  Heart attack  Father     ROS- All systems are reviewed and negative except as per the HPI above  Physical Exam:  Filed Vitals:    10/16/11 1018   BP:  142/101   Pulse:  65   Resp:  18   Height:  5\' 10"  (1.778 m)   Weight:  214 lb 12.8 oz (97.433 kg)    GEN- The patient is well appearing, alert and oriented x 3 today.  Head- normocephalic, atraumatic  Eyes- Sclera clear, conjunctiva pink  Ears- hearing intact  Oropharynx- clear  Neck- supple, no JVP  Lymph- no cervical lymphadenopathy  Lungs- Clear  to ausculation bilaterally, normal work of breathing  Heart- Regular rate and rhythm, no murmurs, rubs or gallops, PMI not laterally displaced  GI- soft, NT, ND, + BS  Extremities- no clubbing, cyanosis, or edema  MS- no significant deformity or atrophy  Skin- no rash or lesion  Psych- euthymic mood, full affect  Neuro- strength and sensation are intact  Pacemaker interrogation today is reviewed and reveals afib burden to be 77%  Assessment and Plan:   Atrial fibrillation - Hillis Range, MD   The  patient has symptomatic atrial fibrillation. Though his afib has previously been paroxysmal, I am concerned that he is becoming persistent with time. His LA is at least moderately dilated. He has failed medical therapy with flecainide and rhythmol.  Therapeutic strategies for afib including medicine and ablation were discussed in detail with the patient today. Risk, benefits, and alternatives to EP study and radiofrequency ablation for afib were also discussed in detail today. These risks include but are not limited to stroke, bleeding, vascular damage, tamponade, perforation, damage to the esophagus, lungs, and other structures, pulmonary vein stenosis, worsening renal function, pacemaker lead dislodgement, and death. The patient understands these risk and wishes to proceed. We will therefore proceed with catheter ablation today.

## 2011-11-28 NOTE — Anesthesia Preprocedure Evaluation (Addendum)
Anesthesia Evaluation  Patient identified by MRN, date of birth, ID band Patient awake    Reviewed: Allergy & Precautions, H&P , NPO status , Patient's Chart, lab work & pertinent test results, reviewed documented beta blocker date and time   Airway Mallampati: I TM Distance: <3 FB     Dental  (+) Teeth Intact   Pulmonary asthma , sleep apnea and Continuous Positive Airway Pressure Ventilation ,  breath sounds clear to auscultation        Cardiovascular hypertension, Pt. on medications + dysrhythmias Atrial Fibrillation + pacemaker Rhythm:Irregular Rate:Normal     Neuro/Psych  Neuromuscular disease CVA, No Residual Symptoms    GI/Hepatic Neg liver ROS, hiatal hernia, GERD-  ,  Endo/Other  negative endocrine ROS  Renal/GU negative Renal ROS     Musculoskeletal negative musculoskeletal ROS (+)   Abdominal (+)  Abdomen: soft. Bowel sounds: normal.  Peds  Hematology negative hematology ROS (+)   Anesthesia Other Findings   Reproductive/Obstetrics                         Anesthesia Physical Anesthesia Plan  ASA: III  Anesthesia Plan: General   Post-op Pain Management:    Induction: Intravenous  Airway Management Planned: LMA  Additional Equipment:   Intra-op Plan:   Post-operative Plan: Extubation in OR  Informed Consent: I have reviewed the patients History and Physical, chart, labs and discussed the procedure including the risks, benefits and alternatives for the proposed anesthesia with the patient or authorized representative who has indicated his/her understanding and acceptance.     Plan Discussed with: CRNA  Anesthesia Plan Comments:         Anesthesia Quick Evaluation

## 2011-11-28 NOTE — Addendum Note (Signed)
Addendum  created 11/28/11 1342 by Ellin Goodie, CRNA   Modules edited:Anesthesia Flowsheet, Anesthesia Medication Administration

## 2011-11-28 NOTE — Transfer of Care (Signed)
Immediate Anesthesia Transfer of Care Note  Patient: [name]  Procedure(s) Performed: Procedure(s) (LRB): ATRIAL FIBRILLATION ABLATION (N/A)  Patient Location: PACU and Cath Lab  Anesthesia Type: General  Level of Consciousness: awake and alert   Airway & Oxygen Therapy: Patient Spontanous Breathing  Post-op Assessment: Report given to PACU RN  Post vital signs: stable  Complications: No apparent anesthesia complications

## 2011-11-28 NOTE — Preoperative (Signed)
Beta Blockers   Reason not to administer Beta Blockers:Not Applicable 

## 2011-11-28 NOTE — Op Note (Signed)
SURGEON:  Hillis Range, MD  PREPROCEDURE DIAGNOSES: 1. Persistent atrial fibrillation.  POSTPROCEDURE DIAGNOSES: 1. Persistent atrial fibrillation.  PROCEDURES: 1. Comprehensive electrophysiologic study. 2. Coronary sinus pacing and recording. 3. Three-dimensional mapping of atrial fibrillation (PVI with additional mapping of CAFE's in the left atrium) 4. Ablation of atrial fibrillation.(PVI with additional ablation of CAFE's in the left atrium) 5. Arterial blood pressure monitoring. 6. Intracardiac echocardiography. 7. Transseptal puncture of an intact septum. 8.  Rotational Angiography with processing at an independent workstation 9. Arrhythmia induction with pacing 10.External cardioversion. 11.Dual chamber pacemaker interrogation and reprogramming  INTRODUCTION:  [name] is a 66 y.o. male with a history of persistent atrial fibrillation who now presents for EP study and radiofrequency ablation.  The patient reports initially being diagnosed with atrial fibrillation in several years ago after presenting with symptomatic palpitations and fatgiue.  The patient has failed medical therapy with flecainide and rhythmol.  He is s/p PPM for tachycardia/ bradycardia syndrome.  The patient therefore presents today for catheter ablation of atrial fibrillation.  DESCRIPTION OF PROCEDURE:  Informed written consent was obtained, and the patient was brought to the electrophysiology lab in a fasting state.  The patient was adequately sedated with intravenous medications as outlined in the anesthesia report. The patient's SJM Zephry XL PPM was interrogated before and after the case and lead measurements were confirmed to be stable.  He was left as previously programmed at DDIR 60 bpm following the case.  The patient's left and right groins were prepped and draped in the usual sterile fashion by the EP lab staff.  Using a percutaneous Seldinger technique, two 7-French and one 8-French hemostasis  sheaths were placed into the right common femoral vein.  A 4- [place] hemostasis sheath was placed in the right common femoral artery for blood pressure monitoring.  An 11-French hemostasis sheath was placed into the left common femoral vein.   3 Dimensional Rotational Angiography: A 5 french pigtail catheter was introduced through the right common femoral vein and advanced into the inferior venocava.  3 demential rotational angiography was then performed by power injection of 100cc of nonionic contrast.  Reprocessing at an independent work station was then performed.   This demonstrated a moderate sized left atrium with 4 separate pulmonary veins which were quite large in size.  There were no anomalous veins or significant abnormalities.  A 3 dimensional rendering of the left atrium was then merged using [employer] onto the WellPoint system and registered with intracardiac echo (see below).  The pigtail catheter was then removed.  Catheter Placement:  A 7-French Biosense Webster Decapolar coronary sinus catheter was introduced through the right common femoral vein and advanced into the coronary sinus for recording and pacing from this location.  A 6-French quadripolar Josephson catheter was introduced through the right common femoral vein and advanced into the right ventricle for recording and pacing.  This catheter was then pulled back to the His bundle location.    Initial Measurements: The patient presented to the electrophysiology lab in atrial fibrillation.  His average RR interval measured 1200 msec.  The HV interval 52 msec.     Intracardiac Echocardiography: A 10-French Biosense Webster AcuNav intracardiac echocardiography catheter was introduced through the left common femoral vein and advanced into the right atrium. Intracardiac echocardiography was performed of the left atrium, and a three-dimensional anatomical rendering of the left atrium was performed using CARTO sound  technology.  The patient was noted to have a moderate  sized left atrium.  The interatrial septum was prominent but not aneurysmal. All 4 pulmonary veins were visualized and noted to have separate ostia.  The pulmonary veins were large in size.  The left atrial appendage was visualized and did not reveal thrombus.   There was no evidence of pulmonary vein stenosis.   Transseptal Puncture: The middle right common femoral vein sheath was exchanged for an 8.5 [place] SL2 transseptal sheath and transseptal access was achieved in a standard fashion using a Brockenbrough needle under biplane fluoroscopy with intracardiac echocardiography confirmation of the transseptal puncture.  Once transseptal access had been achieved, heparin was administered intravenously and intra- arterially in order to maintain an ACT of greater than 350 seconds throughout the procedure.   3D Mapping and Ablation: The His bundle catheter was removed and in its place a 3.5 mm Biosense Webster EZ [employer] ablation catheter was advanced into the right atrium.  The transseptal sheath was pulled back into the IVC over a guidewire.  The ablation catheter was advanced across the transseptal hole using the wire as a guide.  The transseptal sheath was then re-advanced over the guidewire into the left atrium.  A duodecapolar Biosense Webster circular mapping catheter was introduced through the transseptal sheath and positioned over the mouth of all 4 pulmonary veins.  Three-dimensional electroanatomical mapping was performed using CARTO technology.  This demonstrated electrical activity within all four pulmonary veins at baseline. Prodigious conduction was observed within the right superior and right inferior pulmonary veins.  The patient underwent successful sequential electrical isolation and anatomical encircling of all four pulmonary veins using radiofrequency current with a circular mapping catheter as a guide in a wide aria circumferential  ablation pattern.  Complex fractionated electrograms (CFAEs) were then identified and ablated along the roof of the left atrium, base of the left atrial appendage (LAA),  the interatrial septum, along the lateral wall of the left atrium, and above the coronary sinus.     Cardioversion: The patient was then cardioverted to sinus rhythm with a single synchronized 360-J biphasic shock with cardioversion electrodes in the anterior-posterior thoracic configuration. He maintained sinus rhythm initially but then had recurence of afib with catheter manipulation within the left atrial, requiring repeat cardioversion with 360J biphasic.  He remained in sinus rhythm thereafter.  Measurements Following Ablation: In sinus rhythm with RR interval was 1200, with PR 143 msec, QRS , and Qtc 456 msec.  Following ablation the AH interval measured 128 msec with an HV interval of 51 msec. Ventricular pacing was performed, which revealed VA dissociation when pacing at 600 msec.  Rapid atrial pacing was performed, which revealed an AV Wenckebach cycle length of 440 msec.  Rapid atrial pacing was continued down to a cycle length of with no arrhythmias observed. Electroisolation was then again confirmed in all four pulmonary veins.  Intracardiac echocardiography was again performed, which revealed no pericardial effusion.  The procedure was therefore considered completed.  All catheters were removed, and the sheaths were aspirated and flushed.  The patient was transferred to the recovery area for sheath removal per protocol.  A limited bedside transthoracic echocardiogram revealed no pericardial effusion.  There were no early apparent complications.  The patient's SJM Zephry XL PPM was interrogated  after the case and lead measurements were confirmed to be stable.  He was left as previously programmed at DDIR 60 bpm following the procedure today.      CONCLUSIONS: 1. Atrial fibrillation upon presentation.   2.  Rotational Angiography reveals a moderate sized left atrium with four separate large pulmonary veins without evidence of pulmonary vein stenosis. 3. Successful electrical isolation and anatomical encircling of all four pulmonary veins with radiofrequency current (WACA). 3. CFAEs were identified and ablated along the roof of the left atrium,  the interatrial septum, along the lateral wall of the left atrium, and above the coronary sinus.    4. Atrial fibrillation successfully cardioverted to sinus rhythm. 5. No early apparent complications.   Calvin Lafave,MD 11:05 AM 11/28/2011

## 2011-11-28 NOTE — Progress Notes (Signed)
Doing well s/p afib ablation. No concerns at this time.  I anticipate discharge to home in the am.  He will need to continue pradaxa without interruption for 3 months.  Stop rhythmol and diltiazem prior to discharge if he remains in sinus while here.  Follow-up with me in 12 weeks.

## 2011-11-28 NOTE — Progress Notes (Signed)
Utilization Review Completed.[name] T5/21/2013

## 2011-11-29 ENCOUNTER — Other Ambulatory Visit: Payer: Self-pay

## 2011-11-29 DIAGNOSIS — Z01810 Encounter for preprocedural cardiovascular examination: Secondary | ICD-10-CM

## 2011-11-29 NOTE — Discharge Summary (Signed)
ELECTROPHYSIOLOGY DISCHARGE SUMMARY    Patient ID: [name],  MRN: [id number], DOB/AGE: 10/09/1945 66 y.o.  Admit date: 11/28/2011 Discharge date: 11/29/2011  Primary Care Physician: Lucilla Edin, MD, MD Primary Cardiologist: Dr. Berton Mount  Primary Discharge Diagnosis:  1.  Atrial fibrillation s/p RF ablation  Secondary Discharge Diagnoses:  1.  HTN 2.  Sinus node dysfunction s/p PPM 3.  Prior CVA 4.  OSA 5.  Dyslipidemia 6.  Asthma 7.  Barrett's esophagus 8.  GERD  Procedures This Admission:  1.  Atrial fibrillation ablation 11/28/2011 with findings/conclusions as follows - Atrial fibrillation upon presentation. Rotational Angiography reveals a moderate sized left atrium with four separate large pulmonary veins without evidence of pulmonary vein stenosis. Successful electrical isolation and anatomical encircling of all four pulmonary veins with radiofrequency current (WACA). CFAEs were identified and ablated along the roof of the left atrium, the interatrial septum, along the lateral wall of the left atrium, and above the coronary sinus. Atrial fibrillation successfully cardioverted to sinus rhythm. No early apparent complications.  History and Hospital Course:  For full details please see admission H&P. Briefly, [name] is a 66 year old man with a h/o paroxysmal atrial fibrillation who was initially diagnosed with atrial fibrillation in 1998 after presenting with tachypalpitations to a hospital in [place]. He was placed on diltiazem. He had increasing frequency and duration of AF thereafter. He presented with an acute embolic stroke 12/2006. This was felt to be due to AF and he was therefore placed on coumadin. He was subsequently evaluated by Dr Graciela Husbands. He was found to have post termination pauses with his AF and underwent pacemaker implantation 11/15/07. He has been treated with flecainide but continues to have increasing AF burden. He subsequently had propafenone added to  his antiarrhythmic regimen, including flecainide. Despite this, his AF burden continued to increase. The patient has been treated with CPAP for OSA. He has not found any other triggers for his AF. He reports fatigue and decreased exercise tolerance with his AF. He reports mild SOB with moderate activity. Recent echo revealed preserved EF with LA size of 46mm. His Coumadin was transitioned to Pradaxa. He was then referred to Dr. Johney Frame for further recommendations. [name] elected to proceed with AF ablation and after informed consent, he underwent RF ablation for AF on 11/28/2011, with details/conclusions as outlined above. [name] remains hemodynamically stable. He has been ambulating without difficulty. His procedure site is intact with no evidence of bleeding or hematoma. He remains in sinus rhythm. His Rythmol and diltiazem will be discontinued. He will continue Pradaxa without interruption for at least 3 months. He has been seen, examined and deemed stable for discharge by Dr. Berton Mount.  Discharge Vitals: Blood pressure 127/80, pulse 74, temperature 98.5 F (36.9 C), temperature source Oral, resp. rate 15, height 5\' 10"  (1.778 m), weight 204 lb (92.534 kg), SpO2 96.00%.   Labs: None in last 48 hours  Disposition:  The patient is being discharged in stable condition.  Follow-up: Follow-up Information    Follow up with Hillis Range, MD. (In 3 months - our office will contact you with the appointment date and time)    Contact information:   [address], [address] [place] [address] [phone]   Discharge Medications:  Medication List  As of 11/29/2011  8:07 AM   STOP taking these medications     diltiazem 180 MG 24 hr tablet  propafenone 225 MG 12 hr capsule         TAKE these medications     albuterol 108 (90 BASE) MCG/ACT inhaler   Commonly known as: PROVENTIL HFA;VENTOLIN HFA   Inhale 2 puffs into the lungs every 4 (four) hours as needed for wheezing or  shortness of breath.      ANDROGEL 25 MG/2.5GM Gel   Generic drug: Testosterone   Place 1 Package onto the skin daily. Use as directed      colesevelam 625 MG tablet   Commonly known as: WELCHOL   Take 1,875 mg by mouth 2 (two) times daily with a meal.      dabigatran 150 MG Caps   Commonly known as: PRADAXA   Take 150 mg by mouth every 12 (twelve) hours.      ezetimibe 10 MG tablet   Commonly known as: ZETIA   Take 10 mg by mouth daily.      fenofibrate 160 MG tablet   Take 1 tablet (160 mg total) by mouth daily.      Fish Oil 1000 MG Caps   Take 1,000 mg by mouth 2 (two) times daily.      fluvoxaMINE 100 MG tablet   Commonly known as: LUVOX   Take 100 mg by mouth at bedtime.      glucosamine-chondroitin 500-400 MG tablet   Take 1 tablet by mouth 2 (two) times daily.      losartan 50 MG tablet   Commonly known as: COZAAR   Take 50 mg by mouth daily.      metoprolol tartrate 25 MG tablet   Commonly known as: LOPRESSOR   Take 25 mg by mouth 2 (two) times daily.      mometasone 50 MCG/ACT nasal spray   Commonly known as: NASONEX   1-2 puffs each nostril once every night at bedtime      mulitivitamin with minerals Tabs   Take 1 tablet by mouth daily.      omeprazole 40 MG capsule   Commonly known as: PRILOSEC   Take 40 mg by mouth daily.      rOPINIRole 2 MG tablet   Commonly known as: REQUIP   Take 2 mg by mouth at bedtime.      Duration of Discharge Encounter: Greater than 30 minutes including physician time.  Signed, Rick Duff, PA-C 11/29/2011, 8:07 AM  S/p pvi for afib Discharge off rhythmol F/u arranged

## 2011-11-29 NOTE — Progress Notes (Signed)
Pt d/c instructions given to pt and wife to include meds, diet, activity restrictions and fallow-up. Pt and wife verbalize understanding and agreement with d/c instructions. IV x2 d/c site wnl. Bilateral groin site dry, intact, no bleeding or oozing noted, level 0. Pt released to care of wife in no apparent distress.

## 2011-11-29 NOTE — Telephone Encounter (Signed)
Pt states that when he was in recently in the office Porfirio Oar PA was supposed to refill all 5 of his medications, pt is unsure of the names of these medications except for the androgel.  Please call pt when these are sent over to the pharmacy: [phone] Pharmacy:[facility] [address]

## 2011-11-30 ENCOUNTER — Telehealth: Payer: Self-pay

## 2011-11-30 MED ORDER — DILTIAZEM HCL ER COATED BEADS 180 MG PO TB24: 180.0000 mg | ORAL_TABLET | Freq: Two times a day (BID) | ORAL | Status: DC

## 2011-11-30 MED ORDER — FENOFIBRATE 160 MG PO TABS: 160.0000 mg | ORAL_TABLET | Freq: Every day | ORAL | Status: DC

## 2011-11-30 MED ORDER — TESTOSTERONE 25 MG/2.5GM (1%) TD GEL: 1.0000 | Freq: Every day | TRANSDERMAL | Status: DC

## 2011-11-30 MED ORDER — EZETIMIBE 10 MG PO TABS: 10.0000 mg | ORAL_TABLET | Freq: Every day | ORAL | Status: DC

## 2011-11-30 MED ORDER — COLESEVELAM HCL 625 MG PO TABS: 1875.0000 mg | ORAL_TABLET | Freq: Two times a day (BID) | ORAL | Status: DC

## 2011-11-30 NOTE — Telephone Encounter (Signed)
PT IS REQUESTING [name] TO CALL HIM - PT NEEDS TO HAVE A DRUG TEST AND WANTS TO KNOW HOW LONG MEDS STAY IN SYSTEM? ALSO HE SHOULD HAVE FIVE SCRIPTS CALLED INTO [facility]AC[facility] [address]   Testosterone (ANDROGEL) 25 MG/2.5GM GEL rOPINIRole (REQUIP) 2 MG tablet ezetimibe (ZETIA) 10 MG tablet fluvoxaMINE (LUVOX) 100 MG tablet colesevelam (WELCHOL) 625 MG tablet

## 2011-11-30 NOTE — Telephone Encounter (Signed)
Called patient. I did call in the Androgel for the patient

## 2011-11-30 NOTE — Telephone Encounter (Signed)
Advise patient I've sent in meds-Androgel printed.  Let me know if I've missed one!

## 2011-11-30 NOTE — Telephone Encounter (Signed)
[name], do you want to write Rxs for pt's medications that were listed at his OV w/you?

## 2011-12-01 NOTE — Telephone Encounter (Signed)
Notified pt that Rxs were sent in and asked for details on drug test. Pt was worried that the Vicodin that was given to him at hosp for his ablation this past Tues would show up on his drug test next Tues. Per [name], told pt it will probably not show up by then, but pt should disclose that he was given that and when. He can probably get a note from the MD if necessary.

## 2011-12-01 NOTE — Telephone Encounter (Signed)
Depends on the drug and the test.  Please get additional details.

## 2011-12-06 ENCOUNTER — Other Ambulatory Visit: Payer: Self-pay

## 2011-12-06 ENCOUNTER — Telehealth: Payer: Self-pay

## 2011-12-06 NOTE — Telephone Encounter (Signed)
Patient request ppw be filled out. PPW at [facility].

## 2011-12-06 NOTE — Telephone Encounter (Signed)
[name], the form is in your box.

## 2011-12-06 NOTE — Telephone Encounter (Signed)
Pt states he picked up his rx for androgel and when he got home saw it was for the wrong dosage. States chelle has rx'd him 1% 5g for over a year now and the rx he picked up was for 1% 2.5g.  Would like to know what to do. Also, pt dropped off a pe form today to be filled out.   [facility] [address]  Best: [phone]  bf

## 2011-12-07 MED ORDER — TESTOSTERONE 50 MG/5GM (1%) TD GEL: 5.0000 g | Freq: Every day | TRANSDERMAL | Status: DC

## 2011-12-07 NOTE — Telephone Encounter (Signed)
Correct dose written/printed.  Patient can destroy the wrong dose one. Form completed.  Please copy for scanning into EMR (patient gets original)

## 2011-12-09 NOTE — Telephone Encounter (Signed)
LMOM THAT RX AND FORM IS READY FOR PICKUP

## 2011-12-13 ENCOUNTER — Ambulatory Visit (INDEPENDENT_AMBULATORY_CARE_PROVIDER_SITE_OTHER): Payer: Medicare Other

## 2011-12-13 VITALS — BP 119/75 | HR 72 | Temp 97.5°F | Resp 18 | Ht 68.5 in | Wt 208.0 lb

## 2011-12-13 DIAGNOSIS — Z0000 Encounter for general adult medical examination without abnormal findings: Secondary | ICD-10-CM

## 2011-12-14 NOTE — Progress Notes (Signed)
  Subjective:    Patient ID: [name], male    DOB: [birth year], 66 y.o.   MRN: [id number]  HPI  Do not remember  Review of Systems     Objective:   Physical Exam   Do not remember     Assessment & Plan:

## 2011-12-15 ENCOUNTER — Other Ambulatory Visit: Payer: Self-pay | Admitting: Physician Assistant

## 2011-12-16 ENCOUNTER — Encounter (INDEPENDENT_AMBULATORY_CARE_PROVIDER_SITE_OTHER): Payer: Medicare Other | Admitting: Internal Medicine

## 2011-12-16 DIAGNOSIS — Z111 Encounter for screening for respiratory tuberculosis: Secondary | ICD-10-CM

## 2011-12-16 LAB — TB SKIN TEST
Induration: 0 mm
TB Skin Test: NEGATIVE

## 2011-12-19 ENCOUNTER — Telehealth: Payer: Self-pay | Admitting: Internal Medicine

## 2011-12-19 NOTE — Telephone Encounter (Signed)
Pt to restart his Rythmol 225 twice daily and call us back at the end of the week if he is still out of rhythm

## 2011-12-19 NOTE — Telephone Encounter (Signed)
New msg Pt said he had ablation on 052113 and he is in afib today. Please call

## 2012-01-01 ENCOUNTER — Other Ambulatory Visit: Payer: Self-pay | Admitting: *Deleted

## 2012-01-01 MED ORDER — LOSARTAN POTASSIUM 50 MG PO TABS: 50.0000 mg | ORAL_TABLET | Freq: Every day | ORAL | Status: DC

## 2012-01-03 ENCOUNTER — Ambulatory Visit (INDEPENDENT_AMBULATORY_CARE_PROVIDER_SITE_OTHER): Payer: Medicare Other | Admitting: Family Medicine

## 2012-01-03 ENCOUNTER — Ambulatory Visit: Payer: Medicare Other

## 2012-01-03 ENCOUNTER — Encounter: Payer: Self-pay | Admitting: Family Medicine

## 2012-01-03 ENCOUNTER — Ambulatory Visit (HOSPITAL_COMMUNITY)
Admission: RE | Admit: 2012-01-03 | Discharge: 2012-01-03 | Disposition: A | Discharge status: Home / Self Care | Payer: Medicare Other | Source: Ambulatory Visit | Attending: Family Medicine | Admitting: Family Medicine

## 2012-01-03 VITALS — BP 149/80 | HR 65 | Temp 98.0°F | Resp 18 | Ht 69.0 in | Wt 209.2 lb

## 2012-01-03 DIAGNOSIS — M545 Low back pain, unspecified: Secondary | ICD-10-CM | POA: Insufficient documentation

## 2012-01-03 DIAGNOSIS — R319 Hematuria, unspecified: Secondary | ICD-10-CM | POA: Insufficient documentation

## 2012-01-03 DIAGNOSIS — M949 Disorder of cartilage, unspecified: Secondary | ICD-10-CM | POA: Insufficient documentation

## 2012-01-03 DIAGNOSIS — M899 Disorder of bone, unspecified: Secondary | ICD-10-CM | POA: Insufficient documentation

## 2012-01-03 DIAGNOSIS — R109 Unspecified abdominal pain: Secondary | ICD-10-CM | POA: Insufficient documentation

## 2012-01-03 DIAGNOSIS — K4020 Bilateral inguinal hernia, without obstruction or gangrene, not specified as recurrent: Secondary | ICD-10-CM | POA: Insufficient documentation

## 2012-01-03 DIAGNOSIS — R160 Hepatomegaly, not elsewhere classified: Secondary | ICD-10-CM | POA: Insufficient documentation

## 2012-01-03 DIAGNOSIS — N281 Cyst of kidney, acquired: Secondary | ICD-10-CM | POA: Insufficient documentation

## 2012-01-03 DIAGNOSIS — K7689 Other specified diseases of liver: Secondary | ICD-10-CM | POA: Insufficient documentation

## 2012-01-03 LAB — POCT UA - MICROSCOPIC ONLY
Casts, Ur, LPF, POC: NEGATIVE
Yeast, UA: NEGATIVE

## 2012-01-03 LAB — POCT URINALYSIS DIPSTICK
Glucose, UA: NEGATIVE
Leukocytes, UA: NEGATIVE
Nitrite, UA: NEGATIVE
Protein, UA: NEGATIVE
Urobilinogen, UA: 0.2

## 2012-01-03 MED ORDER — HYDROCODONE-ACETAMINOPHEN 5-325 MG PO TABS: 1.0000 | ORAL_TABLET | Freq: Four times a day (QID) | ORAL | Status: DC | PRN

## 2012-01-03 MED ORDER — CYCLOBENZAPRINE HCL 5 MG PO TABS: ORAL_TABLET | ORAL | Status: DC

## 2012-01-03 NOTE — Progress Notes (Signed)
Subjective:    Patient ID: [name], male    DOB: [birth year], 66 y.o.   MRN: [id number]  HPI [name] is a 66 y.o. male Hx multiple med problems per problem list.   Hx of nephrolithiasis with 4x63mm L ureteral stone 2011/08/21, passed on own 28-Aug-2011.    C/o back pain - past 6 days.  Lifting scuba tanks 5 and 6 days ago - noticed low R lbp next 1-2 days. Worst this am when going to [place].  Tx; tylenol, heat with minimal change.  No bowel or bladder incontinence, no saddle anesthesia, no lower extremity weakness.  No currnent groin or radiating pain.  Review of Systems  Constitutional: Negative for fever and chills.  Genitourinary: Negative for urgency, hematuria, decreased urine volume and difficulty urinating.  Musculoskeletal: Positive for back pain and arthralgias.       Objective:   Physical Exam  Constitutional: He appears well-developed and well-nourished.  HENT:  Head: Normocephalic and atraumatic.  Neck: Normal range of motion.  Pulmonary/Chest: Effort normal.  Abdominal: Soft. There is no tenderness. There is no CVA tenderness.  Musculoskeletal: He exhibits tenderness.       Lumbar back: He exhibits tenderness and spasm. He exhibits normal range of motion, no bony tenderness and no edema.       Back:  Neurological: He is alert. He has normal strength. No sensory deficit.  Reflex Scores:      Patellar reflexes are 2+ on the right side and 2+ on the left side.      Achilles reflexes are 2+ on the right side and 2+ on the left side.      Able to heel and toe walk without difficulty.  Skin: Skin is warm and dry.  Psychiatric: He has a normal mood and affect. His behavior is normal.    [facility] reading (PRIMARY) by  Dr. Neva Seat:  Scoliosis  Decrease space L2-3, L1-2, with spondylosis.  ?3mm opacity laterally to L2 on R.     Results for orders placed in visit on 01/03/12  POCT UA - MICROSCOPIC ONLY      Component Value Range   WBC, Ur, HPF, POC 1-3     RBC, urine,  microscopic 4-7     Bacteria, U Microscopic 1+     Mucus, UA pos     Epithelial cells, urine per micros 0-1     Crystals, Ur, HPF, POC calcium oxalate     Casts, Ur, LPF, POC neg     Yeast, UA neg    POCT URINALYSIS DIPSTICK      Component Value Range   Color, UA yellow     Clarity, UA clear     Glucose, UA neg     Bilirubin, UA neg     Ketones, UA neg     Spec Grav, UA >=1.030     Blood, UA trace     pH, UA 5.5     Protein, UA neg     Urobilinogen, UA 0.2     Nitrite, UA neg     Leukocytes, UA Negative        Assessment & Plan:  [name] is a 66 y.o. male R low back pain - hematuria.  Hx of nephrolithiasis.  DDX of LS strain with increase lifting prior. Check CT abd pelvis without contrast today.  Start flomax 0.4mg  qd if positive for stone.  Will Rx   Lortab Q 6h prn for now.  If negative  for stone: flexeril, heat, ROM, sx care.   Addendum - CT report at 1800;  IMPRESSION:  Hepatomegaly with fatty liver.  Small left renal cyst.  No acute findings.   Discussed results with pt - suspected lumbar strain with muscle spasm.  rx flexeril - SED, sx care, low intensity ROM ok.  rtc if not improving in next 1 week, sooner if worse.  Trace hematuria- follow up for repeat U/A in next 4-6 weeks, sooner if any new symptoms.

## 2012-01-04 ENCOUNTER — Other Ambulatory Visit: Payer: Self-pay | Admitting: *Deleted

## 2012-01-04 DIAGNOSIS — I4891 Unspecified atrial fibrillation: Secondary | ICD-10-CM

## 2012-01-04 MED ORDER — PROPAFENONE HCL ER 225 MG PO CP12: 225.0000 mg | ORAL_CAPSULE | Freq: Two times a day (BID) | ORAL | Status: DC

## 2012-01-08 ENCOUNTER — Telehealth: Payer: Self-pay

## 2012-01-08 ENCOUNTER — Telehealth: Payer: Self-pay | Admitting: Internal Medicine

## 2012-01-08 NOTE — Telephone Encounter (Signed)
Spoke w/pt who stated he has a current DOT, but just needs a note stating his BP is controlled w/his medication. Pt stated he doesn't mind coming back in to see [name] and transferred him to 104 who said they can fit him in to see [name] tomorrow at 9 am.

## 2012-01-08 NOTE — Telephone Encounter (Signed)
Pt needs letter stating that his high blood pressure is being maintained with meds and ids under control,  ok to drive a bus, pls call [phone] when ready would like to pick it up

## 2012-01-08 NOTE — Telephone Encounter (Addendum)
When last seen by Dr Allred BP was elevated, no further recordings of bp under control, pt has been having bp eval with pcp, pt will call pcp and get letter, told pt to call if any problem, pt agreed to plan.

## 2012-01-08 NOTE — Telephone Encounter (Signed)
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

## 2012-01-08 NOTE — Telephone Encounter (Signed)
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

## 2012-01-09 ENCOUNTER — Ambulatory Visit (INDEPENDENT_AMBULATORY_CARE_PROVIDER_SITE_OTHER): Payer: Medicare Other | Admitting: Physician Assistant

## 2012-01-09 ENCOUNTER — Encounter: Payer: Self-pay | Admitting: Physician Assistant

## 2012-01-09 VITALS — BP 134/82 | HR 69 | Temp 97.0°F | Resp 16 | Ht 69.0 in | Wt 206.0 lb

## 2012-01-09 DIAGNOSIS — I10 Essential (primary) hypertension: Secondary | ICD-10-CM

## 2012-01-09 NOTE — Progress Notes (Signed)
  Subjective:    Patient ID: [name], male    DOB: [birth year], 66 y.o.   MRN: [id number]  HPI 66 year old is here to day to obtain a letter addressed to [facility] that his hypertension is under controlled. Pt states he has no other concerns. After his recent ablation, he was told to stop the Diltiazem.  He hopes to be able to D/C the rhythmol soon, as the scarring resolves.  Review of Systems No chest pain, SOB, HA, dizziness, vision change, N/V, diarrhea, constipation, dysuria, urinary urgency or frequency, myalgias, arthralgias or rash.     Objective:   Physical Exam  Vital signs noted. Well-developed, well nourished WM who is awake, alert and oriented, in NAD. HEENT: [place]/AT, sclera and conjunctiva are clear.   Neck: supple, non-tender, no lymphadenopathy, thyromegaly. Heart: irregularly irregular, no murmur Lungs: CTA Extremities: no cyanosis, clubbing or edema. Skin: warm and dry without rash.     Assessment & Plan:   1. Unspecified essential hypertension    Controlled on current regimen.  Continue follow-up with cardiology as per their instructions.

## 2012-01-24 ENCOUNTER — Other Ambulatory Visit: Payer: Self-pay | Admitting: Physician Assistant

## 2012-01-31 ENCOUNTER — Telehealth: Payer: Self-pay

## 2012-01-31 NOTE — Telephone Encounter (Signed)
CHECKING ON PAPER WORK TO SEE IF IT HAS BEEN SENT OFF YET HE STATES HE CAN NOT PICK IT UP WE HAVE TO SEND  BEST NUMBER [id number]

## 2012-02-01 NOTE — Telephone Encounter (Signed)
I completed all the portions I could.  His eye doctor needs to complete page 3.  The chart is in my box in the provider lounge.

## 2012-02-01 NOTE — Telephone Encounter (Signed)
Paperwork pertains to [facility] d/w [name] and she will finish filling out forms today and will be mailed off Friday.  He states we will need to mail this off.

## 2012-02-02 NOTE — Telephone Encounter (Signed)
[name] will be in to pick up his paperwork to bring to his eye doctor for completion. It is in [name]'s mailbox at the moment I believe, can we have it ready for him to pick up at check in please.

## 2012-02-20 ENCOUNTER — Other Ambulatory Visit: Payer: Self-pay | Admitting: Internal Medicine

## 2012-03-04 ENCOUNTER — Encounter: Payer: Self-pay | Admitting: Internal Medicine

## 2012-03-04 ENCOUNTER — Ambulatory Visit (INDEPENDENT_AMBULATORY_CARE_PROVIDER_SITE_OTHER): Payer: Medicare Other | Admitting: Internal Medicine

## 2012-03-04 VITALS — BP 145/88 | HR 100 | Ht 70.0 in | Wt 207.0 lb

## 2012-03-04 DIAGNOSIS — I495 Sick sinus syndrome: Secondary | ICD-10-CM

## 2012-03-04 DIAGNOSIS — I4891 Unspecified atrial fibrillation: Secondary | ICD-10-CM

## 2012-03-04 LAB — PACEMAKER DEVICE OBSERVATION
AL AMPLITUDE: 5 mv
AL IMPEDENCE PM: 466 Ohm
AL THRESHOLD: 0.75 V
ATRIAL PACING PM: 14
BAMS-0001: 150 {beats}/min
BATTERY VOLTAGE: 2.78 V
DEVICE MODEL PM: 1209334
RV LEAD AMPLITUDE: 11.1 mv
RV LEAD IMPEDENCE PM: 520 Ohm
RV LEAD THRESHOLD: 1 V
VENTRICULAR PACING PM: 21

## 2012-03-04 NOTE — Patient Instructions (Addendum)
Your physician wants you to follow-up in: February WITH DR Logan Bores will receive a reminder letter in the mail two months in advance. If you don't receive a letter, please call our office to schedule the follow-up appointment.   Your physician recommends that you schedule a follow-up appointment in: 2 MONTHS WITH DR Johney Frame

## 2012-03-11 ENCOUNTER — Encounter: Payer: Self-pay | Admitting: Internal Medicine

## 2012-03-11 NOTE — Progress Notes (Signed)
PCP: Lucilla Edin, MD Primary EP:  Dr Eda Paschal [name] is a 66 y.o. male who presents today for routine electrophysiology followup.  Since his afib ablation, the patient reports doing very well.  He denies procedure related complications.  He has had some ERAF but feels that in general his afib burden is significantly decreased.  His energy continues to improve.  Today, he denies symptoms of chest pain, shortness of breath,  lower extremity edema, dizziness, presyncope, or syncope.  The patient is otherwise without complaint today.   Past Medical History  Diagnosis Date  . Atrial fibrillation   . OSA (obstructive sleep apnea)     on CPAP  . Allergic rhinitis   . Hyperlipidemia   . Asthma   . GERD (gastroesophageal reflux disease)   . Barrett's esophagus 03/1993  . RLS (restless legs syndrome)   . Tubular adenoma polyp of rectum 07/1998  . Hiatal hernia   . Hemorrhoids   . AVM (arteriovenous malformation)   . Cataract   . Hypertension   . Sinoatrial node dysfunction     s/p PPM  . Stroke 2008    A-Fib   Past Surgical History  Procedure Date  . Cholecystectomy open   . Knee arthroscopy   . Tonsillectomy   . Pacemaker insertion 11/15/07    St. Jude Dr. Dannielle Karvonen implanted by Dr Graciela Husbands  . Tee without cardioversion 11/27/2011    Procedure: TRANSESOPHAGEAL ECHOCARDIOGRAM (TEE);  Surgeon: Pricilla Riffle, MD;  Location: [facility] ENDOSCOPY;  Service: Cardiovascular;  Laterality: N/A;  . Atrial fibrillation ablation 11/28/11    Afib ablation by Dr Johney Frame    Current Outpatient Prescriptions  Medication Sig Dispense Refill  . albuterol (PROVENTIL HFA;VENTOLIN HFA) 108 (90 BASE) MCG/ACT inhaler Inhale 2 puffs into the lungs every 4 (four) hours as needed for wheezing or shortness of breath.  1 Inhaler  prn  . colesevelam (WELCHOL) 625 MG tablet Take 3 tablets (1,875 mg total) by mouth 2 (two) times daily with a meal.  180 tablet  5  . dabigatran (PRADAXA) 150 MG CAPS Take 150 mg by mouth every 12  (twelve) hours.      Marland Kitchen ezetimibe (ZETIA) 10 MG tablet Take 1 tablet (10 mg total) by mouth daily.  30 tablet  5  . fenofibrate 160 MG tablet Take 1 tablet (160 mg total) by mouth daily.  30 tablet  5  . fluvoxaMINE (LUVOX) 100 MG tablet TAKE 1 TABLET AT BEDTIME.  34 tablet  5  . glucosamine-chondroitin 500-400 MG tablet Take 1 tablet by mouth 2 (two) times daily.        Marland Kitchen losartan (COZAAR) 50 MG tablet Take 1 tablet (50 mg total) by mouth daily.  30 tablet  5  . metoprolol tartrate (LOPRESSOR) 25 MG tablet Take 25 mg by mouth 2 (two) times daily.      . mometasone (NASONEX) 50 MCG/ACT nasal spray 1-2 puffs each nostril once every night at bedtime  17 g  prn  . Multiple Vitamin (MULITIVITAMIN WITH MINERALS) TABS Take 1 tablet by mouth daily.      . Omega-3 Fatty Acids (FISH OIL) 1000 MG CAPS Take 1,000 mg by mouth 2 (two) times daily.       Marland Kitchen omeprazole (PRILOSEC) 40 MG capsule TAKE ONE CAPSULE BY MOUTH EVERY DAY  30 capsule  5  . propafenone (RYTHMOL SR) 225 MG 12 hr capsule Take 1 capsule (225 mg total) by mouth 2 (two) times daily.  60 capsule  6  . rOPINIRole (REQUIP) 3 MG tablet TAKE (1) ONE TABLET DAILY.  34 tablet  4  . testosterone (ANDROGEL) 50 MG/5GM GEL Place 5 g onto the skin daily.  30 Package  5    Physical Exam: Filed Vitals:   03/04/12 1542  BP: 145/88  Pulse: 100  Height: 5\' 10"  (1.778 m)  Weight: 207 lb (93.895 kg)    GEN- The patient is well appearing, alert and oriented x 3 today.   Head- normocephalic, atraumatic Eyes-  Sclera clear, conjunctiva pink Ears- hearing intact Oropharynx- clear Lungs- Clear to ausculation bilaterally, normal work of breathing Chest- pacemaker pocket is well healed Heart- Regular rate and rhythm, no murmurs, rubs or gallops, PMI not laterally displaced GI- soft, NT, ND, + BS Extremities- no clubbing, cyanosis, or edema  Pacemaker interrogation- reviewed in detail today,  See PACEART report  Assessment and Plan:

## 2012-03-11 NOTE — Assessment & Plan Note (Signed)
Normal pacemaker function See Pace Art report 

## 2012-03-11 NOTE — Assessment & Plan Note (Signed)
Doing well s/p ablation but with ERAF.  Pacemaker interrogation today reveals that his afib burden has abruptly decreased when compared to prior to ablation (near 100% of the time prior to the ablation, no about 25% of the time). I think that we should continue our current strategy and see how he does.  If his afib burden remains high then we should consider repeat ablation however, I am hopeful that his afib burden will continue to decline at this point.

## 2012-04-16 ENCOUNTER — Ambulatory Visit: Payer: Medicare Other

## 2012-04-16 ENCOUNTER — Ambulatory Visit: Payer: Medicare Other | Admitting: Family Medicine

## 2012-04-16 VITALS — BP 165/85 | HR 80 | Temp 98.5°F | Resp 20 | Ht 68.5 in | Wt 208.0 lb

## 2012-04-16 DIAGNOSIS — R05 Cough: Secondary | ICD-10-CM | POA: Insufficient documentation

## 2012-04-16 MED ORDER — GUAIFENESIN-CODEINE 100-10 MG/5ML PO SYRP: 5.0000 mL | ORAL_SOLUTION | Freq: Three times a day (TID) | ORAL | Status: DC | PRN

## 2012-04-16 NOTE — Progress Notes (Signed)
[name] is a 66 y.o. male who presents to [facility] today for cough since yesterday. Recently traveled to [place] and backward he went on a cruise and did scuba diving. He arrived back this morning.  He denies any shortness of breath  palpitations fevers chills dyspnea. He's tried some over-the-counter cold medications which have not helped. He notes positive sick contacts in his recent travels. He denies any incidents while scuba diving. No leg swelling. He notes sharp pleuritic-type chest pain with coughing.  Denies any constant crushing radiating chest pain; denies any dysphagia.  Medical problems for age for fibrillation with pacer status post ablation. On Pradaxa  no new medication  PMH: Reviewed  History  Substance Use Topics  . Smoking status: Never Smoker   . Smokeless tobacco: Never Used  . Alcohol Use: Yes     social   ROS as above  Medications reviewed. Current Outpatient Prescriptions  Medication Sig Dispense Refill  . albuterol (PROVENTIL HFA;VENTOLIN HFA) 108 (90 BASE) MCG/ACT inhaler Inhale 2 puffs into the lungs every 4 (four) hours as needed for wheezing or shortness of breath.  1 Inhaler  prn  . colesevelam (WELCHOL) 625 MG tablet Take 3 tablets (1,875 mg total) by mouth 2 (two) times daily with a meal.  180 tablet  5  . dabigatran (PRADAXA) 150 MG CAPS Take 150 mg by mouth every 12 (twelve) hours.      Marland Kitchen ezetimibe (ZETIA) 10 MG tablet Take 1 tablet (10 mg total) by mouth daily.  30 tablet  5  . fenofibrate 160 MG tablet Take 1 tablet (160 mg total) by mouth daily.  30 tablet  5  . fluvoxaMINE (LUVOX) 100 MG tablet TAKE 1 TABLET AT BEDTIME.  34 tablet  5  . glucosamine-chondroitin 500-400 MG tablet Take 1 tablet by mouth 2 (two) times daily.        Marland Kitchen losartan (COZAAR) 50 MG tablet Take 1 tablet (50 mg total) by mouth daily.  30 tablet  5  . metoprolol tartrate (LOPRESSOR) 25 MG tablet Take 25 mg by mouth 2 (two) times daily.      . mometasone (NASONEX) 50 MCG/ACT nasal  spray 1-2 puffs each nostril once every night at bedtime  17 g  prn  . Multiple Vitamin (MULITIVITAMIN WITH MINERALS) TABS Take 1 tablet by mouth daily.      . Omega-3 Fatty Acids (FISH OIL) 1000 MG CAPS Take 1,000 mg by mouth 2 (two) times daily.       Marland Kitchen omeprazole (PRILOSEC) 40 MG capsule TAKE ONE CAPSULE BY MOUTH EVERY DAY  30 capsule  5  . propafenone (RYTHMOL SR) 225 MG 12 hr capsule Take 1 capsule (225 mg total) by mouth 2 (two) times daily.  60 capsule  6  . rOPINIRole (REQUIP) 3 MG tablet TAKE (1) ONE TABLET DAILY.  34 tablet  4  . testosterone (ANDROGEL) 50 MG/5GM GEL Place 5 g onto the skin daily.  30 Package  5  . guaiFENesin-codeine (ROBITUSSIN AC) 100-10 MG/5ML syrup Take 5 mLs by mouth 3 (three) times daily as needed for cough.  120 mL  0    Exam:  BP 165/85  Pulse 80  Temp 98.5 F (36.9 C) (Oral)  Resp 20  Ht 5' 8.5" (1.74 m)  Wt 208 lb (94.348 kg)  BMI 31.17 kg/m2  SpO2 97% Gen: Well NAD HEENT: EOMI,  MMM Lungs: CTABL Nl WOB Heart: RRR no MRG Abd: NABS, NT, ND Exts: Non edematous  BL  LE, warm and well perfused.   Chest x-ray: No acute pulmonary changes.  Assessment and Plan: 66 y.o. male with cough with pleuritic chest pain. Likely URI. Will treat symptomatically with codeine and guaifenesin.  Chest x-ray vital signs and exam are within normal limits. No red flag signs or symptoms.  Plan to followup as needed.   Discussed warning signs or symptoms. Please see discharge instructions. Patient expresses understanding.

## 2012-04-16 NOTE — Patient Instructions (Addendum)
Thank you for coming in today. I think you have a cold.  Your xray looks ok.  If you pain with coughing gets worse or you get worse stop in and see Dr. Neva Seat on Thursday or Friday nights.  Call or go to the emergency room if you get worse, have trouble breathing, have chest pains, or palpitations.

## 2012-04-17 ENCOUNTER — Telehealth: Payer: Self-pay

## 2012-04-17 NOTE — Telephone Encounter (Signed)
PATIENTS SPOUSE CALLED SAYING HER HUSBAND WAS SEEN LAST NIGHT AND THAT HE IS WORSE THAN YESTERDAY. SHE WANTED TO SPEAK TO EITHER CORY OR [name] IN REGARDS TO PATIENT. PLEASE CALL BACK AS SOON AS POSSIBLE AT [phone]. THANK YOU!

## 2012-04-17 NOTE — Telephone Encounter (Signed)
[name], Pt reports that the cough med did help him to sleep last night, and he is not running a fever. He states that since his OV he has started coughing up greenish brown mucus and cong has also moved into his sinuses. D/W pt use of Mucinex and pt agreed, but pt requests that an Abx be sent in to [facility] due to his worsening and the color to his now productive cough.

## 2012-04-18 ENCOUNTER — Ambulatory Visit (INDEPENDENT_AMBULATORY_CARE_PROVIDER_SITE_OTHER): Payer: Medicare Other | Admitting: Family Medicine

## 2012-04-18 VITALS — BP 148/82 | HR 71 | Temp 98.2°F | Resp 17 | Ht 69.0 in | Wt 209.0 lb

## 2012-04-18 DIAGNOSIS — J40 Bronchitis, not specified as acute or chronic: Secondary | ICD-10-CM

## 2012-04-18 DIAGNOSIS — R05 Cough: Secondary | ICD-10-CM

## 2012-04-18 DIAGNOSIS — J9801 Acute bronchospasm: Secondary | ICD-10-CM

## 2012-04-18 MED ORDER — DOXYCYCLINE HYCLATE 100 MG PO TABS: 100.0000 mg | ORAL_TABLET | Freq: Two times a day (BID) | ORAL | Status: DC

## 2012-04-18 NOTE — Progress Notes (Signed)
  Subjective:    Patient ID: [name], male    DOB: Jun 24, 1946, 66 y.o.   MRN: [id number]  HPI FRED FRANZEN is a 66 y.o. male Seen 2 days ago by Dr. Denyse Amass. Cough of 2 days duration then. Recently traveled to [place] and backward he went on a cruise and did scuba diving. Hx from last ov: denies any shortness of breath  palpitations fevers chills dyspnea. He's tried some over-the-counter cold medications which have not helped. He notes positive sick contacts in his recent travels. He denies any incidents while scuba diving. No leg swelling. He notes sharp pleuritic-type chest pain with coughing.  Denies any constant crushing radiating chest pain; denies any dysphagia.  Suspected uri - treated with codeine/guaief cough syrup.   Phone note reviewed. Now productive cough, green brown productive cough with speck of blood.  Tried mucinex.  No fever. Congestion in sinuses as well. Chest is worse.  No dyspnea. Wheeze at times. Used albuterol twice in past few days. No leg pain or chest pain.     Review of Systems  Constitutional: Negative for fever and chills.  Respiratory: Positive for cough and wheezing. Negative for shortness of breath.   Cardiovascular: Negative for chest pain and leg swelling.       Objective:   Physical Exam  Constitutional: He is oriented to person, place, and time. He appears well-developed and well-nourished.  HENT:  Head: Normocephalic and atraumatic.  Right Ear: Tympanic membrane, external ear and ear canal normal.  Left Ear: Tympanic membrane, external ear and ear canal normal.  Nose: No rhinorrhea.  Mouth/Throat: Oropharynx is clear and moist and mucous membranes are normal. No oropharyngeal exudate or posterior oropharyngeal erythema.  Eyes: Conjunctivae normal are normal. Pupils are equal, round, and reactive to light.  Neck: Neck supple.  Cardiovascular: Normal rate, regular rhythm, normal heart sounds and intact distal pulses.   No murmur  heard. Pulmonary/Chest: Effort normal and breath sounds normal. No respiratory distress. He has no wheezes. He has no rhonchi. He has no rales.  Abdominal: Soft. There is no tenderness.  Lymphadenopathy:    He has no cervical adenopathy.  Neurological: He is alert and oriented to person, place, and time.  Skin: Skin is warm and dry. No rash noted.  Psychiatric: He has a normal mood and affect. His behavior is normal.       Assessment & Plan:  YISRAEL OBRYAN is a 66 y.o. male 1. Cough   2. Bronchitis   3. Bronchospasm    Possible secondary bact bronchitis after uri, with comorbidities.  No wheeze on exam, but can use albuterol if needed - rtc if frequent or increased use. understanding expressed.  No z ak due to arrhythmia risk with med check.  Will start doxycycline. Cont mucinex during day and codeine at night.

## 2012-04-18 NOTE — Patient Instructions (Signed)
continue mucinex during the day, robitussin with codeine at night if needed only, and do not take this if wheezing.  Start antibiotic, albuterol if needed, but recheck if having to use this more than few times per day. Return to the clinic or go to the nearest emergency room if any of your symptoms worsen or new symptoms occur.

## 2012-04-19 NOTE — Telephone Encounter (Signed)
This message not received by me until now.  It appears that he has been re-evaluated by Dr. Neva Seat in the meantime.  Please call patient for an update on his condition.

## 2012-04-20 NOTE — Telephone Encounter (Signed)
Left message for patient to return call.

## 2012-04-29 ENCOUNTER — Other Ambulatory Visit: Payer: Self-pay | Admitting: Internal Medicine

## 2012-05-06 ENCOUNTER — Encounter: Payer: Self-pay | Admitting: *Deleted

## 2012-05-06 ENCOUNTER — Ambulatory Visit (INDEPENDENT_AMBULATORY_CARE_PROVIDER_SITE_OTHER): Payer: Medicare Other | Admitting: Internal Medicine

## 2012-05-06 ENCOUNTER — Other Ambulatory Visit: Payer: Self-pay | Admitting: *Deleted

## 2012-05-06 ENCOUNTER — Encounter: Payer: Self-pay | Admitting: Internal Medicine

## 2012-05-06 VITALS — BP 120/71 | HR 69 | Ht 69.0 in | Wt 211.2 lb

## 2012-05-06 DIAGNOSIS — I495 Sick sinus syndrome: Secondary | ICD-10-CM

## 2012-05-06 DIAGNOSIS — I4891 Unspecified atrial fibrillation: Secondary | ICD-10-CM

## 2012-05-06 DIAGNOSIS — Z01812 Encounter for preprocedural laboratory examination: Secondary | ICD-10-CM

## 2012-05-06 LAB — PACEMAKER DEVICE OBSERVATION
AL AMPLITUDE: 5 mv
AL IMPEDENCE PM: 459 Ohm
AL THRESHOLD: 0.75 V
BATTERY VOLTAGE: 2.75 V
DEVICE MODEL PM: 1209334
RV LEAD AMPLITUDE: 12 mv

## 2012-05-06 NOTE — Patient Instructions (Signed)

## 2012-05-06 NOTE — Progress Notes (Signed)
  PCP:  DAUB, STEVE A, MD Primary EP:  Dr Klein  The patient presents today for routine electrophysiology followup.  Since last being seen in our clinic, the patient reports doing very well.  Unfortunately, he continues to have intermittent afib.  Though his afib is improved post ablation, it is not resolved.  He reports symptoms of palpitations and fatigue with afib. Today, he denies symptoms of chest pain, shortness of breath, orthopnea, PND, lower extremity edema, dizziness, presyncope, syncope, or neurologic sequela.  The patient feels that he is tolerating medications without difficulties and is otherwise without complaint today.   Past Medical History  Diagnosis Date  . Atrial fibrillation   . OSA (obstructive sleep apnea)     on CPAP  . Allergic rhinitis   . Hyperlipidemia   . Asthma   . GERD (gastroesophageal reflux disease)   . Barrett's esophagus 03/1993  . RLS (restless legs syndrome)   . Tubular adenoma polyp of rectum 07/1998  . Hiatal hernia   . Hemorrhoids   . AVM (arteriovenous malformation)   . Cataract   . Hypertension   . Sinoatrial node dysfunction     s/p PPM  . Stroke 2008    A-Fib   Past Surgical History  Procedure Date  . Cholecystectomy open   . Knee arthroscopy   . Tonsillectomy   . Pacemaker insertion 11/15/07    St. Jude Dr. Zephyr implanted by Dr Klein  . Tee without cardioversion 11/27/2011    Procedure: TRANSESOPHAGEAL ECHOCARDIOGRAM (TEE);  Surgeon: Paula V Ross, MD;  Location: [facility] ENDOSCOPY;  Service: Cardiovascular;  Laterality: N/A;  . Atrial fibrillation ablation 11/28/11    Afib ablation by Dr Kameryn Tisdel    Current Outpatient Prescriptions  Medication Sig Dispense Refill  . albuterol (PROVENTIL HFA;VENTOLIN HFA) 108 (90 BASE) MCG/ACT inhaler Inhale 2 puffs into the lungs every 4 (four) hours as needed for wheezing or shortness of breath.  1 Inhaler  prn  . colesevelam (WELCHOL) 625 MG tablet Take 3 tablets (1,875 mg total) by mouth 2 (two)  times daily with a meal.  180 tablet  5  . dabigatran (PRADAXA) 150 MG CAPS Take 150 mg by mouth every 12 (twelve) hours.      . doxycycline (VIBRA-TABS) 100 MG tablet Take 1 tablet (100 mg total) by mouth 2 (two) times daily.  20 tablet  0  . ezetimibe (ZETIA) 10 MG tablet Take 1 tablet (10 mg total) by mouth daily.  30 tablet  5  . fenofibrate 160 MG tablet Take 1 tablet (160 mg total) by mouth daily.  30 tablet  5  . fluvoxaMINE (LUVOX) 100 MG tablet TAKE 1 TABLET AT BEDTIME.  34 tablet  5  . glucosamine-chondroitin 500-400 MG tablet Take 1 tablet by mouth 2 (two) times daily.        . guaiFENesin-codeine (ROBITUSSIN AC) 100-10 MG/5ML syrup Take 5 mLs by mouth 3 (three) times daily as needed for cough.  120 mL  0  . losartan (COZAAR) 50 MG tablet Take 1 tablet (50 mg total) by mouth daily.  30 tablet  5  . metoprolol tartrate (LOPRESSOR) 25 MG tablet Take 25 mg by mouth 2 (two) times daily.      . mometasone (NASONEX) 50 MCG/ACT nasal spray 1-2 puffs each nostril once every night at bedtime  17 g  prn  . Multiple Vitamin (MULITIVITAMIN WITH MINERALS) TABS Take 1 tablet by mouth daily.      . Omega-3 Fatty   Acids (FISH OIL) 1000 MG CAPS Take 1,000 mg by mouth 2 (two) times daily.       . omeprazole (PRILOSEC) 40 MG capsule TAKE ONE CAPSULE BY MOUTH EVERY DAY  30 capsule  5  . PRADAXA 150 MG CAPS TAKE 1 CAPSULE BY MOUTH EVERY 12 (TWELVE) HOURS.  60 capsule  6  . propafenone (RYTHMOL SR) 225 MG 12 hr capsule Take 1 capsule (225 mg total) by mouth 2 (two) times daily.  60 capsule  6  . rOPINIRole (REQUIP) 3 MG tablet TAKE (1) ONE TABLET DAILY.  34 tablet  4  . testosterone (ANDROGEL) 50 MG/5GM GEL Place 5 g onto the skin daily.  30 Package  5    Allergies  Allergen Reactions  . Niacin Itching  . Statins     Liver damage    History   Social History  . Marital Status: Married    Spouse Name: N/A    Number of Children: 1  . Years of Education: N/A   Occupational History  . High School  Business Teacher   .     Social History Main Topics  . Smoking status: Never Smoker   . Smokeless tobacco: Never Used  . Alcohol Use: Yes     social  . Drug Use: No  . Sexually Active: Yes   Other Topics Concern  . Not on file   Social History Narrative   Pt lives in [place].  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  He previously a high school marketing/ business teacher.  Enjoys scuba diving.    Family History  Problem Relation Age of Onset  . Sleep apnea Brother   . Stroke Mother   . Heart attack Father     ROS-  All systems are reviewed and are negative except as outlined in the HPI above   Physical Exam: Filed Vitals:   05/06/12 1416  BP: 120/71  Pulse: 69  Height: 5' 9" (1.753 m)  Weight: 211 lb 3.2 oz (95.8 kg)    GEN- The patient is well appearing, alert and oriented x 3 today.   Head- normocephalic, atraumatic Eyes-  Sclera clear, conjunctiva pink Ears- hearing intact Oropharynx- clear Neck- supple, no JVP Lymph- no cervical lymphadenopathy Lungs- Clear to ausculation bilaterally, normal work of breathing Chest- pacemaker pocket is well healed Heart- Regular rate and rhythm, no murmurs, rubs or gallops, PMI not laterally displaced GI- soft, NT, ND, + BS Extremities- no clubbing, cyanosis, or edema MS- no significant deformity or atrophy Skin- no rash or lesion Psych- euthymic mood, full affect Neuro- strength and sensation are intact  Pacemaker interrogation- reviewed in detail today,  See PACEART report  Assessment and Plan:  1, Bradycardia Normal pacemaker function See Pace Art report No changes today  2. Afib Afib burden prior to ablation was approaching 100%.  Presently, his afib burden is 31%.  He continues to take rhythmol and pradaxa. Therapeutic strategies for afib including medicine and repeat ablation were discussed in detail with the patient today. Risk, benefits, and alternatives to repeat EP study  and radiofrequency ablation for afib were also discussed in detail today. These risks include but are not limited to stroke, bleeding, vascular damage, tamponade, perforation, damage to the esophagus, lungs, and other structures, pulmonary vein stenosis, worsening renal function, pacemaker lead dislodgement, and death. The patient understands these risk and wishes to proceed.  We will therefore proceed with catheter ablation at the next   available time. 

## 2012-05-08 NOTE — Progress Notes (Signed)
Note reviewed, and agree with documentation and plan.  

## 2012-05-16 ENCOUNTER — Telehealth: Payer: Self-pay | Admitting: Internal Medicine

## 2012-05-16 ENCOUNTER — Encounter: Payer: Self-pay | Admitting: Physician Assistant

## 2012-05-16 ENCOUNTER — Ambulatory Visit (INDEPENDENT_AMBULATORY_CARE_PROVIDER_SITE_OTHER): Payer: Medicare Other | Admitting: Physician Assistant

## 2012-05-16 ENCOUNTER — Encounter (HOSPITAL_COMMUNITY): Payer: Self-pay | Admitting: Pharmacy Technician

## 2012-05-16 VITALS — BP 147/85 | HR 66 | Temp 97.7°F | Resp 16 | Ht 69.5 in | Wt 212.0 lb

## 2012-05-16 DIAGNOSIS — G4733 Obstructive sleep apnea (adult) (pediatric): Secondary | ICD-10-CM

## 2012-05-16 DIAGNOSIS — I10 Essential (primary) hypertension: Secondary | ICD-10-CM

## 2012-05-16 DIAGNOSIS — E291 Testicular hypofunction: Secondary | ICD-10-CM

## 2012-05-16 DIAGNOSIS — K219 Gastro-esophageal reflux disease without esophagitis: Secondary | ICD-10-CM

## 2012-05-16 DIAGNOSIS — Z125 Encounter for screening for malignant neoplasm of prostate: Secondary | ICD-10-CM

## 2012-05-16 DIAGNOSIS — E785 Hyperlipidemia, unspecified: Secondary | ICD-10-CM

## 2012-05-16 DIAGNOSIS — I4891 Unspecified atrial fibrillation: Secondary | ICD-10-CM

## 2012-05-16 DIAGNOSIS — G2581 Restless legs syndrome: Secondary | ICD-10-CM

## 2012-05-16 DIAGNOSIS — Z5181 Encounter for therapeutic drug level monitoring: Secondary | ICD-10-CM

## 2012-05-16 LAB — CBC WITH DIFFERENTIAL/PLATELET
Basophils Absolute: 0 10*3/uL (ref 0.0–0.1)
Basophils Relative: 1 % (ref 0–1)
Eosinophils Absolute: 0.1 10*3/uL (ref 0.0–0.7)
HCT: 41.6 % (ref 39.0–52.0)
MCH: 31.7 pg (ref 26.0–34.0)
MCHC: 34.9 g/dL (ref 30.0–36.0)
Monocytes Absolute: 0.5 10*3/uL (ref 0.1–1.0)
Neutro Abs: 3 10*3/uL (ref 1.7–7.7)
RDW: 13.1 % (ref 11.5–15.5)

## 2012-05-16 LAB — LIPID PANEL
Cholesterol: 226 mg/dL — ABNORMAL HIGH (ref 0–200)
HDL: 35 mg/dL — ABNORMAL LOW (ref >39)
Total CHOL/HDL Ratio: 6.5 Ratio
Triglycerides: 775 mg/dL — ABNORMAL HIGH (ref <150)

## 2012-05-16 LAB — COMPREHENSIVE METABOLIC PANEL
Alkaline Phosphatase: 51 U/L (ref 39–117)
BUN: 20 mg/dL (ref 6–23)
CO2: 25 mEq/L (ref 19–32)
Creat: 1.2 mg/dL (ref 0.50–1.35)
Glucose, Bld: 67 mg/dL — ABNORMAL LOW (ref 70–99)
Sodium: 140 mEq/L (ref 135–145)
Total Bilirubin: 0.5 mg/dL (ref 0.3–1.2)
Total Protein: 6.9 g/dL (ref 6.0–8.3)

## 2012-05-16 NOTE — Telephone Encounter (Signed)
Pt called  Tee same day  Be at Short Stay at 9:30

## 2012-05-16 NOTE — Telephone Encounter (Signed)
New problem:    Status of appt time for TEE.

## 2012-05-16 NOTE — Progress Notes (Signed)
Subjective:    Patient ID: [name], male    DOB: 08/08/1945, 66 y.o.   MRN: [id number]  HPI  This 66 y.o. male presents for evaluation of hyperlipidemia, hypogonadism, and other chronic medical problems.  He will have a second ablation 05/28/2012, in hopes of further reducing or eliminating his Afib.  Otherwise, he's doing well.  No CP.  SOB only with vigorous activity.   Review of Systems As above, otherwise negative.   Past Medical History  Diagnosis Date  . Atrial fibrillation   . OSA (obstructive sleep apnea)     on CPAP  . Allergic rhinitis   . Hyperlipidemia   . Asthma   . GERD (gastroesophageal reflux disease)   . Barrett's esophagus 03/1993  . RLS (restless legs syndrome)   . Tubular adenoma polyp of rectum 07/1998  . Hiatal hernia   . Hemorrhoids   . AVM (arteriovenous malformation)   . Cataract   . Hypertension   . Sinoatrial node dysfunction     s/p PPM  . Stroke 2008    A-Fib    Past Surgical History  Procedure Date  . Cholecystectomy open   . Knee arthroscopy   . Tonsillectomy   . Pacemaker insertion 11/15/07    St. Jude Dr. Dannielle Karvonen implanted by Dr Graciela Husbands  . Tee without cardioversion 11/27/2011    Procedure: TRANSESOPHAGEAL ECHOCARDIOGRAM (TEE);  Surgeon: Pricilla Riffle, MD;  Location: [facility] ENDOSCOPY;  Service: Cardiovascular;  Laterality: N/A;  . Atrial fibrillation ablation 11/28/11    Afib ablation by Dr Johney Frame    Prior to Admission medications   Medication Sig Start Date End Date Taking? Authorizing Provider  albuterol (PROVENTIL HFA;VENTOLIN HFA) 108 (90 BASE) MCG/ACT inhaler Inhale 2 puffs into the lungs every 4 (four) hours as needed for wheezing or shortness of breath. 11/16/11 11/15/12 Yes Waymon Budge, MD  colesevelam [facility]) 625 MG tablet Take 3 tablets (1,875 mg total) by mouth 2 (two) times daily with a meal. 11/30/11  Yes Eli Adami S Lively Haberman, PA-C  dabigatran (PRADAXA) 150 MG CAPS Take 150 mg by mouth every 12 (twelve) hours. 03/03/11  Yes Duke Salvia, MD  ezetimibe (ZETIA) 10 MG tablet Take 1 tablet (10 mg total) by mouth daily. 11/30/11  Yes Dontee Jaso S Rossi Silvestro, PA-C  fenofibrate 160 MG tablet Take 1 tablet (160 mg total) by mouth daily. 11/30/11  Yes Chadrick Sprinkle S Muscab Brenneman, PA-C  fluvoxaMINE (LUVOX) 100 MG tablet TAKE 1 TABLET AT BEDTIME. 01/24/12  Yes Heather M Marte, PA-C  glucosamine-chondroitin 500-400 MG tablet Take 1 tablet by mouth 2 (two) times daily.     Yes Historical Provider, MD  guaiFENesin-codeine (ROBITUSSIN AC) 100-10 MG/5ML syrup Take 5 mLs by mouth 3 (three) times daily as needed for cough. 04/16/12  Yes Rodolph Bong, MD  losartan (COZAAR) 50 MG tablet Take 1 tablet (50 mg total) by mouth daily. 01/01/12  Yes Duke Salvia, MD  metoprolol tartrate (LOPRESSOR) 25 MG tablet Take 25 mg by mouth 2 (two) times daily.   Yes Historical Provider, MD  mometasone (NASONEX) 50 MCG/ACT nasal spray 1-2 puffs each nostril once every night at bedtime 11/16/11 11/15/12 Yes Clinton D Young, MD  Multiple Vitamin (MULITIVITAMIN WITH MINERALS) TABS Take 1 tablet by mouth daily.   Yes Historical Provider, MD  Omega-3 Fatty Acids (FISH OIL) 1000 MG CAPS Take 1,000 mg by mouth 2 (two) times daily.    Yes Historical Provider, MD  omeprazole (PRILOSEC) 40 MG capsule TAKE  ONE CAPSULE BY MOUTH EVERY DAY 02/20/12  Yes Duke Salvia, MD  PRADAXA 150 MG CAPS TAKE 1 CAPSULE BY MOUTH EVERY 12 (TWELVE) HOURS. 04/29/12  Yes Duke Salvia, MD  propafenone (RYTHMOL SR) 225 MG 12 hr capsule Take 1 capsule (225 mg total) by mouth 2 (two) times daily. 01/04/12 01/03/13 Yes Duke Salvia, MD  rOPINIRole (REQUIP) 3 MG tablet TAKE (1) ONE TABLET DAILY. 12/15/11  Yes Anders Simmonds, PA-C  testosterone (ANDROGEL) 50 MG/5GM GEL Place 5 g onto the skin daily. 12/07/11  Yes Lakindra Wible Tessa Lerner, PA-C    Allergies  Allergen Reactions  . Niacin Itching  . Statins     Liver damage    History   Social History  . Marital Status: Married    Spouse Name: Darl Pikes    Number of  Children: 1  . Years of Education: 16+   Occupational History  . Geophysicist/field seismologist   .    [employer]     Social History Main Topics  . Smoking status: Never Smoker   . Smokeless tobacco: Never Used  . Alcohol Use: Yes     Comment: social  . Drug Use: No  . Sexually Active: Yes -- Male partner(s)   Other Topics Concern  . Not on file   Social History Narrative   Lives with his wife, in [place].  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  Enjoys scuba diving.    Family History  Problem Relation Age of Onset  . Sleep apnea Brother   . Stroke Mother   . Heart attack Father        Objective:   Physical Exam Blood pressure 147/85, pulse 66, temperature 97.7 F (36.5 C), resp. rate 16, height 5' 9.5" (1.765 m), weight 212 lb (96.163 kg). Body mass index is 30.86 kg/(m^2). Well-developed, well nourished WM who is awake, alert and oriented, in NAD. HEENT: [place]/AT, sclera and conjunctiva are clear.   Neck: supple, non-tender, no lymphadenopathy, thyromegaly. Heart: RRR, no murmur Lungs: normal effort, CTA Extremities: no cyanosis, clubbing or edema. Skin: warm and dry without rash. Psychologic: good mood and appropriate affect, normal speech and behavior.     Assessment & Plan:   1. HYPERLIPIDEMIA  Lipid panel  2. Atrial fibrillation  Proceed with ablation as planned  3. GERD  Continue current treatment  4. Hypogonadism male  Testosterone, Comprehensive metabolic panel  5. RESTLESS LEGS SYNDROME  Continue ropinirole  6. OBSTRUCTIVE SLEEP APNEA  Continue CPAP  7. HTN (hypertension)  CBC with Differential  8. Medication monitoring encounter  CMET, PSA and Testosterone.  9. Special screening for malignant neoplasm of prostate  PSA   RTC 3-6 months, sooner prn.

## 2012-05-21 ENCOUNTER — Other Ambulatory Visit (INDEPENDENT_AMBULATORY_CARE_PROVIDER_SITE_OTHER): Payer: Medicare Other

## 2012-05-21 DIAGNOSIS — I4891 Unspecified atrial fibrillation: Secondary | ICD-10-CM

## 2012-05-21 DIAGNOSIS — I495 Sick sinus syndrome: Secondary | ICD-10-CM

## 2012-05-21 LAB — CBC WITH DIFFERENTIAL/PLATELET
Basophils Relative: 0.6 % (ref 0.0–3.0)
Eosinophils Absolute: 0.2 10*3/uL (ref 0.0–0.7)
Eosinophils Relative: 3.1 % (ref 0.0–5.0)
HCT: 48.2 % (ref 39.0–52.0)
Lymphs Abs: 1 10*3/uL (ref 0.7–4.0)
MCHC: 33.5 g/dL (ref 30.0–36.0)
MCV: 96.2 fl (ref 78.0–100.0)
Monocytes Absolute: 0.5 10*3/uL (ref 0.1–1.0)
Neutrophils Relative %: 70.2 % (ref 43.0–77.0)
RBC: 5.01 Mil/uL (ref 4.22–5.81)
WBC: 5.9 10*3/uL (ref 4.5–10.5)

## 2012-05-21 LAB — BASIC METABOLIC PANEL
BUN: 23 mg/dL (ref 6–23)
CO2: 25 mEq/L (ref 19–32)
Chloride: 101 mEq/L (ref 96–112)
Creatinine, Ser: 1.3 mg/dL (ref 0.4–1.5)
Potassium: 4.2 mEq/L (ref 3.5–5.1)

## 2012-05-24 ENCOUNTER — Encounter: Payer: Self-pay | Admitting: Physician Assistant

## 2012-05-28 ENCOUNTER — Ambulatory Visit (HOSPITAL_COMMUNITY)
Admission: RE | Admit: 2012-05-28 | Discharge: 2012-05-29 | Disposition: A | Discharge status: Home / Self Care | Payer: Medicare Other | Source: Ambulatory Visit | Attending: Cardiology | Admitting: Cardiology

## 2012-05-28 ENCOUNTER — Encounter (HOSPITAL_COMMUNITY): Payer: Self-pay | Admitting: Certified Registered Nurse Anesthetist

## 2012-05-28 ENCOUNTER — Ambulatory Visit (HOSPITAL_COMMUNITY): Admission: RE | Admit: 2012-05-28 | Payer: Medicare Other | Source: Ambulatory Visit | Admitting: Internal Medicine

## 2012-05-28 ENCOUNTER — Encounter (HOSPITAL_COMMUNITY): Payer: Self-pay

## 2012-05-28 ENCOUNTER — Ambulatory Visit (HOSPITAL_COMMUNITY): Payer: Medicare Other | Admitting: Certified Registered Nurse Anesthetist

## 2012-05-28 ENCOUNTER — Encounter (HOSPITAL_COMMUNITY)
Admission: RE | Disposition: A | Discharge status: Home / Self Care | Payer: Self-pay | Source: Ambulatory Visit | Attending: Cardiology

## 2012-05-28 DIAGNOSIS — K449 Diaphragmatic hernia without obstruction or gangrene: Secondary | ICD-10-CM

## 2012-05-28 DIAGNOSIS — K2270 Barrett's esophagus without dysplasia: Secondary | ICD-10-CM

## 2012-05-28 DIAGNOSIS — E291 Testicular hypofunction: Secondary | ICD-10-CM

## 2012-05-28 DIAGNOSIS — R55 Syncope and collapse: Secondary | ICD-10-CM

## 2012-05-28 DIAGNOSIS — I059 Rheumatic mitral valve disease, unspecified: Secondary | ICD-10-CM | POA: Insufficient documentation

## 2012-05-28 DIAGNOSIS — K219 Gastro-esophageal reflux disease without esophagitis: Secondary | ICD-10-CM

## 2012-05-28 DIAGNOSIS — E785 Hyperlipidemia, unspecified: Secondary | ICD-10-CM

## 2012-05-28 DIAGNOSIS — G4733 Obstructive sleep apnea (adult) (pediatric): Secondary | ICD-10-CM | POA: Diagnosis present

## 2012-05-28 DIAGNOSIS — E236 Other disorders of pituitary gland: Secondary | ICD-10-CM

## 2012-05-28 DIAGNOSIS — Z950 Presence of cardiac pacemaker: Secondary | ICD-10-CM

## 2012-05-28 DIAGNOSIS — I4891 Unspecified atrial fibrillation: Secondary | ICD-10-CM | POA: Diagnosis present

## 2012-05-28 DIAGNOSIS — J4520 Mild intermittent asthma, uncomplicated: Secondary | ICD-10-CM

## 2012-05-28 DIAGNOSIS — R05 Cough: Secondary | ICD-10-CM

## 2012-05-28 DIAGNOSIS — I10 Essential (primary) hypertension: Secondary | ICD-10-CM | POA: Insufficient documentation

## 2012-05-28 DIAGNOSIS — I6789 Other cerebrovascular disease: Secondary | ICD-10-CM

## 2012-05-28 DIAGNOSIS — R0609 Other forms of dyspnea: Secondary | ICD-10-CM

## 2012-05-28 DIAGNOSIS — G2581 Restless legs syndrome: Secondary | ICD-10-CM

## 2012-05-28 DIAGNOSIS — I495 Sick sinus syndrome: Secondary | ICD-10-CM

## 2012-05-28 DIAGNOSIS — J3089 Other allergic rhinitis: Secondary | ICD-10-CM

## 2012-05-28 HISTORY — PX: TEE WITHOUT CARDIOVERSION: SHX5443

## 2012-05-28 HISTORY — PX: ATRIAL FIBRILLATION ABLATION: SHX5456

## 2012-05-28 LAB — POCT ACTIVATED CLOTTING TIME
Activated Clotting Time: 259 seconds
Activated Clotting Time: 274 seconds

## 2012-05-28 SURGERY — ATRIAL FIBRILLATION ABLATION
Anesthesia: Monitor Anesthesia Care

## 2012-05-28 SURGERY — ECHOCARDIOGRAM, TRANSESOPHAGEAL
Anesthesia: Moderate Sedation

## 2012-05-28 MED ORDER — BUTAMBEN-TETRACAINE-BENZOCAINE 2-2-14 % EX AERO
INHALATION_SPRAY | CUTANEOUS | Status: DC | PRN
  Administered 2012-05-28: 2 via TOPICAL

## 2012-05-28 MED ORDER — PROPAFENONE HCL ER 225 MG PO CP12
225.0000 mg | ORAL_CAPSULE | Freq: Two times a day (BID) | ORAL | Status: DC
  Filled 2012-05-28: qty 1

## 2012-05-28 MED ORDER — HYDROCODONE-ACETAMINOPHEN 5-325 MG PO TABS: 1.0000 | ORAL_TABLET | ORAL | Status: DC | PRN

## 2012-05-28 MED ORDER — ROPINIROLE HCL 1 MG PO TABS
3.0000 mg | ORAL_TABLET | Freq: Three times a day (TID) | ORAL | Status: DC | PRN
  Administered 2012-05-28: 3 mg via ORAL
  Filled 2012-05-28: qty 3

## 2012-05-28 MED ORDER — ACETAMINOPHEN 325 MG PO TABS
650.0000 mg | ORAL_TABLET | ORAL | Status: DC | PRN
  Administered 2012-05-28: 650 mg via ORAL
  Filled 2012-05-28: qty 2

## 2012-05-28 MED ORDER — MIDAZOLAM HCL 5 MG/5ML IJ SOLN
INTRAMUSCULAR | Status: DC | PRN
  Administered 2012-05-28: 2 mg via INTRAVENOUS

## 2012-05-28 MED ORDER — HYDROXYUREA 500 MG PO CAPS
ORAL_CAPSULE | ORAL | Status: AC
  Filled 2012-05-28: qty 1

## 2012-05-28 MED ORDER — MEPERIDINE HCL 25 MG/ML IJ SOLN: 6.2500 mg | INTRAMUSCULAR | Status: DC | PRN

## 2012-05-28 MED ORDER — OXYCODONE HCL 5 MG/5ML PO SOLN: 5.0000 mg | Freq: Once | ORAL | Status: AC | PRN

## 2012-05-28 MED ORDER — BUPIVACAINE HCL (PF) 0.25 % IJ SOLN
INTRAMUSCULAR | Status: AC
  Filled 2012-05-28: qty 60

## 2012-05-28 MED ORDER — SODIUM CHLORIDE 0.9 % IV SOLN: 250.0000 mL | INTRAVENOUS | Status: DC | PRN

## 2012-05-28 MED ORDER — ADULT MULTIVITAMIN W/MINERALS CH
1.0000 | ORAL_TABLET | Freq: Every day | ORAL | Status: DC
  Filled 2012-05-28: qty 1

## 2012-05-28 MED ORDER — LACTATED RINGERS IV SOLN
INTRAVENOUS | Status: DC | PRN
  Administered 2012-05-28 (×2): via INTRAVENOUS

## 2012-05-28 MED ORDER — ONDANSETRON HCL 4 MG/2ML IJ SOLN: 4.0000 mg | Freq: Four times a day (QID) | INTRAMUSCULAR | Status: DC | PRN

## 2012-05-28 MED ORDER — SODIUM CHLORIDE 0.9 % IJ SOLN: 3.0000 mL | INTRAMUSCULAR | Status: DC | PRN

## 2012-05-28 MED ORDER — ONDANSETRON HCL 4 MG/2ML IJ SOLN
INTRAMUSCULAR | Status: DC | PRN
  Administered 2012-05-28: 4 mg via INTRAVENOUS

## 2012-05-28 MED ORDER — PROMETHAZINE HCL 25 MG/ML IJ SOLN: 6.2500 mg | INTRAMUSCULAR | Status: DC | PRN

## 2012-05-28 MED ORDER — MIDAZOLAM HCL 10 MG/2ML IJ SOLN
INTRAMUSCULAR | Status: DC | PRN
  Administered 2012-05-28: 2 mg via INTRAVENOUS
  Administered 2012-05-28 (×2): 1 mg via INTRAVENOUS

## 2012-05-28 MED ORDER — HEPARIN SODIUM (PORCINE) 1000 UNIT/ML IJ SOLN
INTRAMUSCULAR | Status: DC | PRN
  Administered 2012-05-28: 4 mL via INTRAVENOUS
  Administered 2012-05-28: 5 mL via INTRAVENOUS

## 2012-05-28 MED ORDER — FENTANYL CITRATE 0.05 MG/ML IJ SOLN
INTRAMUSCULAR | Status: DC | PRN
  Administered 2012-05-28 (×2): 25 ug via INTRAVENOUS

## 2012-05-28 MED ORDER — SODIUM CHLORIDE 0.9 % IV SOLN
INTRAVENOUS | Status: DC
  Administered 2012-05-28: 500 mL via INTRAVENOUS

## 2012-05-28 MED ORDER — SODIUM CHLORIDE 0.9 % IJ SOLN
3.0000 mL | Freq: Two times a day (BID) | INTRAMUSCULAR | Status: DC
  Administered 2012-05-28: 3 mL via INTRAVENOUS

## 2012-05-28 MED ORDER — PROPOFOL 10 MG/ML IV BOLUS
INTRAVENOUS | Status: DC | PRN
  Administered 2012-05-28 (×2): 30 mg via INTRAVENOUS
  Administered 2012-05-28: 20 mg via INTRAVENOUS
  Administered 2012-05-28 (×2): 30 mg via INTRAVENOUS

## 2012-05-28 MED ORDER — LOSARTAN POTASSIUM 50 MG PO TABS
50.0000 mg | ORAL_TABLET | Freq: Every day | ORAL | Status: DC
  Filled 2012-05-28: qty 1

## 2012-05-28 MED ORDER — FENTANYL CITRATE 0.05 MG/ML IJ SOLN: 25.0000 ug | INTRAMUSCULAR | Status: DC | PRN

## 2012-05-28 MED ORDER — FLUVOXAMINE MALEATE 100 MG PO TABS
100.0000 mg | ORAL_TABLET | Freq: Every day | ORAL | Status: DC
  Administered 2012-05-28: 100 mg via ORAL
  Filled 2012-05-28 (×2): qty 1

## 2012-05-28 MED ORDER — DABIGATRAN ETEXILATE MESYLATE 150 MG PO CAPS
150.0000 mg | ORAL_CAPSULE | Freq: Two times a day (BID) | ORAL | Status: DC
  Administered 2012-05-28: 150 mg via ORAL
  Filled 2012-05-28 (×3): qty 1

## 2012-05-28 MED ORDER — PROTAMINE SULFATE 10 MG/ML IV SOLN
INTRAVENOUS | Status: DC | PRN
  Administered 2012-05-28 (×4): 10 mg via INTRAVENOUS

## 2012-05-28 MED ORDER — FENTANYL CITRATE 0.05 MG/ML IJ SOLN
INTRAMUSCULAR | Status: DC | PRN
  Administered 2012-05-28: 25 ug via INTRAVENOUS
  Administered 2012-05-28: 50 ug via INTRAVENOUS
  Administered 2012-05-28 (×2): 25 ug via INTRAVENOUS

## 2012-05-28 MED ORDER — OXYCODONE HCL 5 MG PO TABS: 5.0000 mg | ORAL_TABLET | Freq: Once | ORAL | Status: AC | PRN

## 2012-05-28 MED ORDER — METOPROLOL TARTRATE 25 MG PO TABS
25.0000 mg | ORAL_TABLET | Freq: Two times a day (BID) | ORAL | Status: DC
  Administered 2012-05-28: 25 mg via ORAL
  Filled 2012-05-28 (×3): qty 1

## 2012-05-28 MED ORDER — PROPOFOL INFUSION 10 MG/ML OPTIME
INTRAVENOUS | Status: DC | PRN
  Administered 2012-05-28: 14:00:00 via INTRAVENOUS
  Administered 2012-05-28: 50 ug/kg/min via INTRAVENOUS

## 2012-05-28 NOTE — CV Procedure (Signed)
Procedure: TEE  Indication: pre-atrial fibrillation ablation.   Sedation: Versed 4 mg IV, Fentanyl 50 mcg IV  Findings: Please see echo section of chart for full report.  Normal LV size, low normal systolic function (EF 50-55%).  Normal RV size and systolic function.  Mild MR.  Mild to moderate biatrial enlargement with no LAA thrombus.    No complications.

## 2012-05-28 NOTE — Interval H&P Note (Signed)
History and Physical Interval Note:  05/28/2012 11:34 AM  [name]  has presented today for surgery, with the diagnosis of AFib  The various methods of treatment have been discussed with the patient and family. After consideration of risks, benefits and other options for treatment, the patient has consented to  Procedure(s) (LRB) with comments: ATRIAL FIBRILLATION ABLATION (N/A) as a surgical intervention .  The patient's history has been reviewed, patient examined, no change in status, stable for surgery.  I have reviewed the patient's chart and labs.  Questions were answered to the patient's satisfaction.     Hillis Range

## 2012-05-28 NOTE — Transfer of Care (Signed)
Immediate Anesthesia Transfer of Care Note  Patient: [name]  Procedure(s) Performed: Procedure(s) (LRB) with comments: ATRIAL FIBRILLATION ABLATION (N/A)  Patient Location: PACU  Anesthesia Type:MAC  Level of Consciousness: awake, alert  and oriented  Airway & Oxygen Therapy: Patient Spontanous Breathing  Post-op Assessment: Report given to PACU RN and Post -op Vital signs reviewed and stable  Post vital signs: Reviewed and stable  Complications: No apparent anesthesia complications

## 2012-05-28 NOTE — Interval H&P Note (Signed)
History and Physical Interval Note:  05/28/2012 10:40 AM  [name]  has presented today for surgery, with the diagnosis of A-TRIAL-FIB  The various methods of treatment have been discussed with the patient and family. After consideration of risks, benefits and other options for treatment, the patient has consented to  Procedure(s) (LRB) with comments: TRANSESOPHAGEAL ECHOCARDIOGRAM (TEE) (N/A) - [name] 12:00 ABLATION as a surgical intervention .  The patient's history has been reviewed, patient examined, no change in status, stable for surgery.  I have reviewed the patient's chart and labs.  Questions were answered to the patient's satisfaction.     [name]

## 2012-05-28 NOTE — H&P (View-Only) (Signed)
Note reviewed, and agree with documentation and plan.  

## 2012-05-28 NOTE — Anesthesia Preprocedure Evaluation (Signed)
Anesthesia Evaluation  Patient identified by MRN, date of birth, ID band Patient awake    Airway Mallampati: II      Dental  (+) Teeth Intact   Pulmonary shortness of breath, asthma , sleep apnea ,  breath sounds clear to auscultation        Cardiovascular hypertension, + dysrhythmias + pacemaker Rhythm:Regular Rate:Normal     Neuro/Psych    GI/Hepatic Neg liver ROS, hiatal hernia, GERD-  ,  Endo/Other    Renal/GU negative Renal ROS     Musculoskeletal   Abdominal (+) + obese,   Peds  Hematology   Anesthesia Other Findings   Reproductive/Obstetrics                           Anesthesia Physical Anesthesia Plan  ASA: III  Anesthesia Plan: MAC   Post-op Pain Management:    Induction: Intravenous  Airway Management Planned: Oral ETT, Mask and Natural Airway  Additional Equipment:   Intra-op Plan:   Post-operative Plan:   Informed Consent: I have reviewed the patients History and Physical, chart, labs and discussed the procedure including the risks, benefits and alternatives for the proposed anesthesia with the patient or authorized representative who has indicated his/her understanding and acceptance.     Plan Discussed with: Surgeon  Anesthesia Plan Comments:         Anesthesia Quick Evaluation

## 2012-05-28 NOTE — H&P (View-Only) (Signed)
PCP:  Lucilla Edin, MD Primary EP:  Dr Graciela Husbands  The patient presents today for routine electrophysiology followup.  Since last being seen in our clinic, the patient reports doing very well.  Unfortunately, he continues to have intermittent afib.  Though his afib is improved post ablation, it is not resolved.  He reports symptoms of palpitations and fatigue with afib. Today, he denies symptoms of chest pain, shortness of breath, orthopnea, PND, lower extremity edema, dizziness, presyncope, syncope, or neurologic sequela.  The patient feels that he is tolerating medications without difficulties and is otherwise without complaint today.   Past Medical History  Diagnosis Date  . Atrial fibrillation   . OSA (obstructive sleep apnea)     on CPAP  . Allergic rhinitis   . Hyperlipidemia   . Asthma   . GERD (gastroesophageal reflux disease)   . Barrett's esophagus 03/1993  . RLS (restless legs syndrome)   . Tubular adenoma polyp of rectum 07/1998  . Hiatal hernia   . Hemorrhoids   . AVM (arteriovenous malformation)   . Cataract   . Hypertension   . Sinoatrial node dysfunction     s/p PPM  . Stroke 2008    A-Fib   Past Surgical History  Procedure Date  . Cholecystectomy open   . Knee arthroscopy   . Tonsillectomy   . Pacemaker insertion 11/15/07    St. Jude Dr. Dannielle Karvonen implanted by Dr Graciela Husbands  . Tee without cardioversion 11/27/2011    Procedure: TRANSESOPHAGEAL ECHOCARDIOGRAM (TEE);  Surgeon: Pricilla Riffle, MD;  Location: [facility] ENDOSCOPY;  Service: Cardiovascular;  Laterality: N/A;  . Atrial fibrillation ablation 11/28/11    Afib ablation by Dr Johney Frame    Current Outpatient Prescriptions  Medication Sig Dispense Refill  . albuterol (PROVENTIL HFA;VENTOLIN HFA) 108 (90 BASE) MCG/ACT inhaler Inhale 2 puffs into the lungs every 4 (four) hours as needed for wheezing or shortness of breath.  1 Inhaler  prn  . colesevelam (WELCHOL) 625 MG tablet Take 3 tablets (1,875 mg total) by mouth 2 (two)  times daily with a meal.  180 tablet  5  . dabigatran (PRADAXA) 150 MG CAPS Take 150 mg by mouth every 12 (twelve) hours.      Marland Kitchen doxycycline (VIBRA-TABS) 100 MG tablet Take 1 tablet (100 mg total) by mouth 2 (two) times daily.  20 tablet  0  . ezetimibe (ZETIA) 10 MG tablet Take 1 tablet (10 mg total) by mouth daily.  30 tablet  5  . fenofibrate 160 MG tablet Take 1 tablet (160 mg total) by mouth daily.  30 tablet  5  . fluvoxaMINE (LUVOX) 100 MG tablet TAKE 1 TABLET AT BEDTIME.  34 tablet  5  . glucosamine-chondroitin 500-400 MG tablet Take 1 tablet by mouth 2 (two) times daily.        Marland Kitchen guaiFENesin-codeine (ROBITUSSIN AC) 100-10 MG/5ML syrup Take 5 mLs by mouth 3 (three) times daily as needed for cough.  120 mL  0  . losartan (COZAAR) 50 MG tablet Take 1 tablet (50 mg total) by mouth daily.  30 tablet  5  . metoprolol tartrate (LOPRESSOR) 25 MG tablet Take 25 mg by mouth 2 (two) times daily.      . mometasone (NASONEX) 50 MCG/ACT nasal spray 1-2 puffs each nostril once every night at bedtime  17 g  prn  . Multiple Vitamin (MULITIVITAMIN WITH MINERALS) TABS Take 1 tablet by mouth daily.      . Omega-3 Fatty  Acids (FISH OIL) 1000 MG CAPS Take 1,000 mg by mouth 2 (two) times daily.       Marland Kitchen omeprazole (PRILOSEC) 40 MG capsule TAKE ONE CAPSULE BY MOUTH EVERY DAY  30 capsule  5  . PRADAXA 150 MG CAPS TAKE 1 CAPSULE BY MOUTH EVERY 12 (TWELVE) HOURS.  60 capsule  6  . propafenone (RYTHMOL SR) 225 MG 12 hr capsule Take 1 capsule (225 mg total) by mouth 2 (two) times daily.  60 capsule  6  . rOPINIRole (REQUIP) 3 MG tablet TAKE (1) ONE TABLET DAILY.  34 tablet  4  . testosterone (ANDROGEL) 50 MG/5GM GEL Place 5 g onto the skin daily.  30 Package  5    Allergies  Allergen Reactions  . Niacin Itching  . Statins     Liver damage    History   Social History  . Marital Status: Married    Spouse Name: N/A    Number of Children: 1  . Years of Education: N/A   Occupational History  . Chief Strategy Officer   .     Social History Main Topics  . Smoking status: Never Smoker   . Smokeless tobacco: Never Used  . Alcohol Use: Yes     social  . Drug Use: No  . Sexually Active: Yes   Other Topics Concern  . Not on file   Social History Narrative   Pt lives in [place].  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  Enjoys scuba diving.    Family History  Problem Relation Age of Onset  . Sleep apnea Brother   . Stroke Mother   . Heart attack Father     ROS-  All systems are reviewed and are negative except as outlined in the HPI above   Physical Exam: Filed Vitals:   05/06/12 1416  BP: 120/71  Pulse: 69  Height: 5\' 9"  (1.753 m)  Weight: 211 lb 3.2 oz (95.8 kg)    GEN- The patient is well appearing, alert and oriented x 3 today.   Head- normocephalic, atraumatic Eyes-  Sclera clear, conjunctiva pink Ears- hearing intact Oropharynx- clear Neck- supple, no JVP Lymph- no cervical lymphadenopathy Lungs- Clear to ausculation bilaterally, normal work of breathing Chest- pacemaker pocket is well healed Heart- Regular rate and rhythm, no murmurs, rubs or gallops, PMI not laterally displaced GI- soft, NT, ND, + BS Extremities- no clubbing, cyanosis, or edema MS- no significant deformity or atrophy Skin- no rash or lesion Psych- euthymic mood, full affect Neuro- strength and sensation are intact  Pacemaker interrogation- reviewed in detail today,  See PACEART report  Assessment and Plan:  1, Bradycardia Normal pacemaker function See Pace Art report No changes today  2. Afib Afib burden prior to ablation was approaching 100%.  Presently, his afib burden is 31%.  He continues to take rhythmol and pradaxa. Therapeutic strategies for afib including medicine and repeat ablation were discussed in detail with the patient today. Risk, benefits, and alternatives to repeat EP study  and radiofrequency ablation for afib were also discussed in detail today. These risks include but are not limited to stroke, bleeding, vascular damage, tamponade, perforation, damage to the esophagus, lungs, and other structures, pulmonary vein stenosis, worsening renal function, pacemaker lead dislodgement, and death. The patient understands these risk and wishes to proceed.  We will therefore proceed with catheter ablation at the next  available time.

## 2012-05-28 NOTE — Op Note (Signed)
SURGEON:  Hillis Range, MD  PREPROCEDURE DIAGNOSES: 1. Persistent atrial fibrillation.  POSTPROCEDURE DIAGNOSES: 1. Persistent atrial fibrillation.  PROCEDURES: 1. Comprehensive electrophysiologic study. 2. Coronary sinus pacing and recording. 3. Three-dimensional mapping of atrial fibrillation 4. Ablation of atrial fibrillation 5. Pacemaker (dual) interrogation and reprogramming 6. Intracardiac echocardiography. 7. Transseptal puncture of an intact septum. 8. Rotational Angiography with processing at an independent workstation 9. Arrhythmia induction with pacing with isuprel infusion  INTRODUCTION:  [name] is a 66 y.o. male with a history of atrial fibrillation who now presents for EP study and radiofrequency ablation.  He previously underwent afib ablation by me 5/13.  His afib burden decreased from 100% to 27%.  He continues to have episodes of afib.  The patient therefore presents today for repeat catheter ablation of atrial fibrillation.  DESCRIPTION OF PROCEDURE:  Informed written consent was obtained, and the patient was brought to the electrophysiology lab in a fasting state.  The patient was adequately sedated with intravenous medications as outlined in the anesthesia report.  The patient's left and right groins were prepped and draped in the usual sterile fashion by the EP lab staff.  Using a percutaneous Seldinger technique, two 7-French and one 11-French hemostasis sheaths were placed into the right common femoral vein.    Catheter Placement:  A 7-French Biosense Webster Decapolar coronary sinus catheter was introduced through the right common femoral vein and advanced into the coronary sinus for recording and pacing from this location.  A 6-French quadripolar Josephson catheter was introduced through the right common femoral vein and advanced into the right ventricle for recording and pacing.  This catheter was then pulled back to the His bundle location.    Initial  Measurements: The patient presented to the electrophysiology lab in sinus rhythm.  His PR interval measured 175 with a QRS duringation of 94 and a QT interval of 430 msec.  The AH interval measured 76 and the HV interval measured 46 msec.     Intracardiac Echocardiography: A 10-French Biosense Webster AcuNav intracardiac echocardiography catheter was introduced through the left common femoral vein and advanced into the right atrium. Intracardiac echocardiography was performed of the left atrium, and a three-dimensional anatomical rendering of the left atrium was performed using CARTO sound technology.  The patient was noted to have a moderate sized left atrium.  The interatrial septum was prominent but not aneurysmal. All 4 pulmonary veins were visualized and noted to have separate ostia.  The pulmonary veins were moderate in size.  The left atrial appendage was visualized and did not reveal thrombus.   There was no evidence of pulmonary vein stenosis.   Transseptal Puncture: The middle right common femoral vein sheath was exchanged for an 8.5 [place] SL2 transseptal sheath and transseptal access was achieved in a standard fashion using a Brockenbrough needle under biplane fluoroscopy with intracardiac echocardiography confirmation of the transseptal puncture.  Once transseptal access had been achieved, heparin was administered intravenously and intra- arterially in order to maintain an ACT of greater than 300 seconds throughout the procedure.   3 Dimensional Rotational Angiography: A 5 french pigtail catheter was introduced through the transseptal sheath and advanced into the left atrium.  3 demential rotational angiography was then performed by power injection of 60cc of nonionic contrast.  Reprocessing at an independent work station was then performed.   This demonstrated a moderate sized left atrium with 4 separate pulmonary veins which were also moderate in size.  There were no anomalous veins  or  significant abnormalities.  There was no pulmonary venous stenosis from the prior ablation procedure.  A 3 dimensional rendering of the left atrium was then merged using [employer] onto the WellPoint system and registered with intracardiac echo (see below).  The pigtail catheter was then removed.  3D Mapping and Ablation: The His bundle catheter was removed and in its place a 3.5 mm Biosense Webster EZ [employer] ablation catheter was advanced into the right atrium.  The transseptal sheath was pulled back into the IVC over a guidewire.  The ablation catheter was advanced across the transseptal hole using the wire as a guide.  The transseptal sheath was then re-advanced over the guidewire into the left atrium.  A duodecapolar Biosense Webster circular mapping catheter was introduced through the transseptal sheath and positioned over the mouth of all 4 pulmonary veins.  Three-dimensional electroanatomical mapping was performed using CARTO technology.  This demonstrated return of electrical activity within the left superior pulmonary vein and right inferior pulmonary vein at baseline. The LIPV and RSPV were quiescent from the prior ablation procedure. The patient underwent electrical reisolation and anatomical encircling of the LSPV and RIPV using radiofrequency current with a circular mapping catheter as a guide.  Additional ablation was performed at the carina between the left and right PVs.  Measurements Following Ablation: Following ablation, Isuprel was infused up to 20 mcg/min with no inducible atrial fibrillation, atrial tachycardia, atrial flutter, or sustained PACs. Following ablation the AH interval measured with an HV interval of 53 msec. Ventricular pacing was performed, which revealed midline decremental VA conduction with a VA Wenckebach cycle length of 550 msec.  Rapid atrial pacing was performed, which revealed an AV Wenckebach cycle length of 410 msec.  No  arrhythmias were induced with RAP down to a cycle length of .  Electroisolation was then again confirmed in all four pulmonary veins.  The procedure was therefore considered completed.  All catheters were removed, and the sheaths were aspirated and flushed. The patient's pacemaker was interrogated and found to be functioning appropriately.  He had pseudofusion observed during his ablation today.  I therefore reprogrammed from DDIR to DDDR with VIP extension on to minimize ventricular pacing.  Following the ablation, P waves measured 2.38mV with an impedance of 453 Ohms and a threshold of 0.75V@0 . .  R waves measured 9mV with an impedance of 422 Ohms and a threshold of 1.0V@0 . .  The patient was transferred to the recovery area for sheath removal per protocol.  A limited bedside transthoracic echocardiogram revealed no pericardial effusion.  There were no early apparent complications.  CONCLUSIONS: 1. Sinus rhythm upon presentation.   2. Rotational Angiography reveals a moderate sized left atrium with four separate pulmonary veins without evidence of pulmonary vein stenosis. 3. Return of electrical activity within the left superior pulmonary vein and right inferior pulmonary vein. The left inferior pulmonary vein and right superior pulmonary vein were quiescent from the prior ablation procedure.Today we performe electrical reisolation and anatomical encircling of the LSPV and RIPV using radiofrequency current.  Additional ablation was performed at the carina between the left and right PVs. 4. No inducible arrhythmias following ablation both on and off of Isuprel 5. No early apparent complications.   Clevland Cork,MD 2:17 PM 05/28/2012

## 2012-05-28 NOTE — Preoperative (Signed)
Beta Blockers   Reason not to administer Beta Blockers:Not Applicable, Hold beta blocker due to other 

## 2012-05-28 NOTE — Progress Notes (Signed)
  Echocardiogram Echocardiogram Transesophageal has been performed.  [name] 05/28/2012, 11:19 AM

## 2012-05-29 ENCOUNTER — Encounter (HOSPITAL_COMMUNITY): Payer: Self-pay | Admitting: Cardiology

## 2012-05-29 ENCOUNTER — Other Ambulatory Visit: Payer: Self-pay | Admitting: Physician Assistant

## 2012-05-29 ENCOUNTER — Encounter (HOSPITAL_COMMUNITY): Payer: Self-pay

## 2012-05-29 DIAGNOSIS — I4891 Unspecified atrial fibrillation: Secondary | ICD-10-CM

## 2012-05-29 LAB — BASIC METABOLIC PANEL
BUN: 17 mg/dL (ref 6–23)
Calcium: 8.8 mg/dL (ref 8.4–10.5)
GFR calc non Af Amer: 59 mL/min — ABNORMAL LOW (ref >90)
Glucose, Bld: 102 mg/dL — ABNORMAL HIGH (ref 70–99)
Sodium: 139 mEq/L (ref 135–145)

## 2012-05-29 NOTE — Progress Notes (Signed)
Utilization Review Completed.[name] T11/20/2013

## 2012-05-29 NOTE — Discharge Summary (Signed)
ELECTROPHYSIOLOGY PROCEDURE DISCHARGE SUMMARY    Patient ID: [name],  MRN: [id number], DOB/AGE: 65-07-1945 65 y.o.  Admit date: 05/28/2012 Discharge date: 05/29/2012  Primary Care Physician: Lesle Chris, MD Electrophysiologist: Klein/Ebbie Cherry  Primary Discharge Diagnosis:  Recurrent atrial fibrillation status post repeat ablation this admission  Secondary Discharge Diagnosis:  1.  Sleep apnea- on CPAP 2.  Hyperlipidemia 3.  GERD 4.  Barrett's esophagus 5.  Sinus node dysfunction status post pacemaker implantation 2009 [facility] Jude Zephyr) 6.  CVA in 2008 7.  Atrial fibrillation status post ablation in May of 2013 by Dr Dell Briner 8.  Chronic anticoagulation with Pradaxa  Procedures This Admission:  1.  Electrophysiology study and radiofrequency catheter ablation on 05-28-2012 by Dr Johney Frame.  This demonstrated sinus rhythm upon presentation, rotational Angiography reveals a moderate sized left atrium with four separate pulmonary veins without evidence of pulmonary vein stenosis, return of electrical activity within the left superior pulmonary vein and right inferior pulmonary vein. The left inferior pulmonary vein and right superior pulmonary vein were quiescent from the prior ablation procedure.Today we performe electrical reisolation and anatomical encircling of the LSPV and RIPV using radiofrequency current. Additional ablation was performed at the carina between the left and right PVs. There were no inducible arrhythmias following ablation both on and off of Isuprel and no early apparent complications.   Brief HPI: [name] is a 66 year old male with a history of afib status post ablation in May of 2013. Since last being seen in our clinic, the patient reports doing very well. Unfortunately, he continues to have intermittent afib. Though his afib is improved post ablation, it is not resolved. He reports symptoms of palpitations and fatigue with afib. Today, he denies symptoms of chest  pain, shortness of breath, orthopnea, PND, lower extremity edema, dizziness, presyncope, syncope, or neurologic sequela. The patient feels that he is tolerating medications without difficulties and is otherwise without complaint today.  Risks, benefits, and alternatives of repeat ablation were reviewed with the patient who wished to proceed.   Hospital Course:  The patient was admitted and underwent ablation of atrial fibrillation with details as outlined above.  He was monitored on telemetry overnight which demonstrated atrial pacing with intrinsic ventricular conduction.  His groin incision was without complication.  Dr Johney Frame examined the patient and considered him stable for discharge to home.  Plans to continue on home mediations and follow up with Dr Johney Frame in 12 weeks.   Discharge Vitals: Blood pressure 104/58, pulse 67, temperature 98.4 F (36.9 C), temperature source Oral, resp. rate 19, height 5\' 9"  (1.753 m), weight 208 lb 6.4 oz (94.53 kg), SpO2 95.00%.    Labs:   Lab Results  Component Value Date   WBC 5.9 05/21/2012   HGB 16.2 05/21/2012   HCT 48.2 05/21/2012   MCV 96.2 05/21/2012   PLT 148.0* 05/21/2012    Lab 05/29/12 0535  NA 139  K 4.2  CL 106  CO2 24  BUN 17  CREATININE 1.25  CALCIUM 8.8  PROT --  BILITOT --  ALKPHOS --  ALT --  AST --  GLUCOSE 102*    Discharge Medications:    Medication List     As of 05/29/2012  9:23 AM    TAKE these medications         albuterol 108 (90 BASE) MCG/ACT inhaler   Commonly known as: PROVENTIL HFA;VENTOLIN HFA   Inhale 2 puffs into the lungs every 4 (four) hours as  needed for wheezing or shortness of breath.      colesevelam 625 MG tablet   Commonly known as: WELCHOL   Take 3 tablets (1,875 mg total) by mouth 2 (two) times daily with a meal.      dabigatran 150 MG Caps   Commonly known as: PRADAXA   Take 150 mg by mouth 2 (two) times daily.      ezetimibe 10 MG tablet   Commonly known as: ZETIA   Take 1 tablet  (10 mg total) by mouth daily.      fenofibrate 160 MG tablet   Take 1 tablet (160 mg total) by mouth daily.      Fish Oil 1000 MG Caps   Take 1,000 mg by mouth 2 (two) times daily.      fluvoxaMINE 100 MG tablet   Commonly known as: LUVOX   Take 100 mg by mouth at bedtime.      glucosamine-chondroitin 500-400 MG tablet   Take 1 tablet by mouth 2 (two) times daily.      losartan 50 MG tablet   Commonly known as: COZAAR   Take 1 tablet (50 mg total) by mouth daily.      metoprolol tartrate 25 MG tablet   Commonly known as: LOPRESSOR   Take 25 mg by mouth 2 (two) times daily.      mometasone 50 MCG/ACT nasal spray   Commonly known as: NASONEX   1-2 puffs each nostril once every night at bedtime      multivitamin with minerals Tabs   Take 1 tablet by mouth daily.      omeprazole 40 MG capsule   Commonly known as: PRILOSEC   Take 40 mg by mouth daily.      propafenone 225 MG 12 hr capsule   Commonly known as: RYTHMOL SR   Take 1 capsule (225 mg total) by mouth 2 (two) times daily.      rOPINIRole 3 MG tablet   Commonly known as: REQUIP      testosterone 50 MG/5GM Gel   Commonly known as: ANDROGEL   Place 5 g onto the skin daily.        Disposition:      Discharge Orders    Future Appointments: Provider: Department: Dept Phone: Center:   08/30/2012 10:00 AM Hillis Range, MD [facility] Main Office [place]) [phone] [id number]   11/18/2012 2:00 PM Waymon Budge, MD [facility] [phone] None     Future Orders Please Complete By Expires   Diet - low sodium heart healthy      Increase activity slowly        Follow-up Information    Follow up with Hillis Range, MD. On 08/30/2012. (At 10:00 AM)    Contact information:   [address] [place] [address] [phone]          Duration of Discharge Encounter: Greater than 30 minutes including physician time.  Signed, Gypsy Balsam, RN, BSN  Hillis Range,  MD 05/29/2012, 9:23 AM

## 2012-05-29 NOTE — Progress Notes (Signed)
Patient discharged to home via wheelchair via volunteer to private vehicle to home with wife, med reconc and followup reviewed with patient and wife, via teachback, verbalizes understanding of instructions, given copy of cardiac diet, Berle Mull RN

## 2012-05-29 NOTE — Progress Notes (Signed)
   ELECTROPHYSIOLOGY ROUNDING NOTE    Patient Name: [name] Date of Encounter: 05-29-2012    SUBJECTIVE:Patient feels well.  No chest pain or shortness of breath.  S/p RFCA of atrial fibrillation 05-28-2012  TELEMETRY: Reviewed telemetry pt in atrial pacing with intrinsic ventricular conduction  Physical Exam: Filed Vitals:   05/28/12 2000 05/28/12 2149 05/29/12 0000 05/29/12 0400  BP: 133/80 127/68 128/75 104/62  Pulse: 71 66 67 67  Temp: 98.3 F (36.8 C)  98.4 F (36.9 C) 98.6 F (37 C)  TempSrc: Oral  Oral Oral  Resp: 24  17 11   Height:      Weight:      SpO2: 98%  95% 96%    GEN- The patient is well appearing, alert and oriented x 3 today.   Head- normocephalic, atraumatic Eyes-  Sclera clear, conjunctiva pink Ears- hearing intact Oropharynx- clear Neck- supple, no JVP Lymph- no cervical lymphadenopathy Lungs- Clear to ausculation bilaterally, normal work of breathing Heart- Regular rate and rhythm, no murmurs, rubs or gallops, PMI not laterally displaced GI- soft, NT, ND, + BS Extremities- no clubbing, cyanosis, or edema MS- no significant deformity or atrophy Skin- no rash or lesion Psych- euthymic mood, full affect Neuro- strength and sensation are intact  LABS: Basic Metabolic Panel:  Basename 05/29/12 0535  NA 139  K 4.2  CL 106  CO2 24  GLUCOSE 102*  BUN 17  CREATININE 1.25  CALCIUM 8.8  MG --  PHOS --    DEVICE INTERROGATION: Device interrogated by industry 05-28-2012.  Lead values including impedence, sensing, threshold within normal values.     Assessment and Plan:  1.  Afib Doing well s/p ablation  Wound care, restrictions reviewed with patient.  Routine follow up scheduled in 3 months with Dr Johney Frame.  Pt on Omeprazole at home.   DC to home  Fayrene Fearing Vivika Poythress,MD

## 2012-06-10 NOTE — Anesthesia Postprocedure Evaluation (Signed)
  Anesthesia Post-op Note  Patient: [name]  Procedure(s) Performed: Procedure(s) (LRB) with comments: ATRIAL FIBRILLATION ABLATION (N/A)  Patient Location: PACU  Anesthesia Type:General  Level of Consciousness: awake  Airway and Oxygen Therapy: Patient Spontanous Breathing  Post-op Pain: none  Post-op Assessment: Post-op Vital signs reviewed  Post-op Vital Signs: stable  Complications: No apparent anesthesia complications

## 2012-06-18 ENCOUNTER — Other Ambulatory Visit: Payer: Self-pay | Admitting: Physician Assistant

## 2012-06-18 MED ORDER — ROPINIROLE HCL 3 MG PO TABS: 3.0000 mg | ORAL_TABLET | Freq: Every day | ORAL | Status: DC

## 2012-06-18 MED ORDER — EZETIMIBE 10 MG PO TABS: 10.0000 mg | ORAL_TABLET | Freq: Every day | ORAL | Status: DC

## 2012-06-18 MED ORDER — FLUVOXAMINE MALEATE 100 MG PO TABS: 100.0000 mg | ORAL_TABLET | Freq: Every day | ORAL | Status: DC

## 2012-06-18 MED ORDER — COLESEVELAM HCL 625 MG PO TABS: 1875.0000 mg | ORAL_TABLET | Freq: Two times a day (BID) | ORAL | Status: DC

## 2012-06-18 MED ORDER — FENOFIBRATE 160 MG PO TABS: 160.0000 mg | ORAL_TABLET | Freq: Every day | ORAL | Status: DC

## 2012-06-20 ENCOUNTER — Other Ambulatory Visit: Payer: Self-pay | Admitting: Physician Assistant

## 2012-07-01 ENCOUNTER — Other Ambulatory Visit: Payer: Self-pay | Admitting: Physician Assistant

## 2012-07-01 ENCOUNTER — Other Ambulatory Visit: Payer: Self-pay | Admitting: Internal Medicine

## 2012-07-07 ENCOUNTER — Other Ambulatory Visit: Payer: Self-pay | Admitting: Internal Medicine

## 2012-07-22 ENCOUNTER — Telehealth: Payer: Self-pay | Admitting: Internal Medicine

## 2012-07-22 NOTE — Telephone Encounter (Signed)
Keep follow up Dr Johney Frame as scheduled  Patient states he did not know about his appointment in 08/2102  He is going to be out of town at that time.  We rescheduled his appointment to 09/11/12 at 9am  Patient aware

## 2012-07-22 NOTE — Telephone Encounter (Signed)
Pt needs to know which appt he needs the one with Allred post ablation or the reminder for [facility]

## 2012-08-04 ENCOUNTER — Other Ambulatory Visit: Payer: Self-pay | Admitting: Internal Medicine

## 2012-08-14 ENCOUNTER — Other Ambulatory Visit: Payer: Self-pay | Admitting: Physician Assistant

## 2012-08-14 ENCOUNTER — Other Ambulatory Visit: Payer: Self-pay | Admitting: Internal Medicine

## 2012-08-22 ENCOUNTER — Other Ambulatory Visit: Payer: Self-pay | Admitting: Physician Assistant

## 2012-08-28 ENCOUNTER — Other Ambulatory Visit: Payer: Self-pay | Admitting: Physician Assistant

## 2012-08-30 ENCOUNTER — Encounter: Payer: Medicare Other | Admitting: Internal Medicine

## 2012-09-11 ENCOUNTER — Encounter: Payer: Medicare Other | Admitting: Internal Medicine

## 2012-09-23 ENCOUNTER — Ambulatory Visit (INDEPENDENT_AMBULATORY_CARE_PROVIDER_SITE_OTHER): Payer: Medicare Other | Admitting: Internal Medicine

## 2012-09-23 ENCOUNTER — Encounter: Payer: Self-pay | Admitting: Internal Medicine

## 2012-09-23 VITALS — BP 144/68 | HR 60 | Ht 69.5 in | Wt 212.0 lb

## 2012-09-23 DIAGNOSIS — I495 Sick sinus syndrome: Secondary | ICD-10-CM

## 2012-09-23 DIAGNOSIS — I4891 Unspecified atrial fibrillation: Secondary | ICD-10-CM

## 2012-09-23 LAB — PACEMAKER DEVICE OBSERVATION
AL AMPLITUDE: 4.8 mv
AL IMPEDENCE PM: 439 Ohm
BAMS-0001: 150 {beats}/min
BATTERY VOLTAGE: 2.78 V
RV LEAD AMPLITUDE: 12 mv
RV LEAD IMPEDENCE PM: 418 Ohm
VENTRICULAR PACING PM: 1

## 2012-09-23 MED ORDER — SILDENAFIL CITRATE 100 MG PO TABS: ORAL_TABLET | ORAL | Status: DC

## 2012-09-23 NOTE — Patient Instructions (Addendum)
Your physician recommends that you schedule a follow-up appointment in 3 months with Dr Allred    

## 2012-10-04 ENCOUNTER — Other Ambulatory Visit: Payer: Self-pay | Admitting: Physician Assistant

## 2012-10-04 ENCOUNTER — Encounter: Payer: Medicare Other | Admitting: Internal Medicine

## 2012-10-07 ENCOUNTER — Encounter: Payer: Self-pay | Admitting: Internal Medicine

## 2012-10-07 NOTE — Progress Notes (Signed)
PCP:  Lucilla Edin, MD Primary EP:  Dr Graciela Husbands  The patient presents today for routine electrophysiology followup.  Since his recent afib ablation, the patient reports doing very well.  He denies procedure related problems.  His afib is improved.  He had ERAF early post ablation though this has significantly improved.  Today, he denies symptoms of chest pain, shortness of breath, orthopnea, PND, lower extremity edema, dizziness, presyncope, syncope, or neurologic sequela.  The patient feels that he is tolerating medications without difficulties and is otherwise without complaint today.   Past Medical History  Diagnosis Date  . Atrial fibrillation   . OSA (obstructive sleep apnea)     on CPAP  . Allergic rhinitis   . Hyperlipidemia   . Asthma   . GERD (gastroesophageal reflux disease)   . Barrett's esophagus 03/1993  . RLS (restless legs syndrome)   . Tubular adenoma polyp of rectum 07/1998  . Hiatal hernia   . Hemorrhoids   . AVM (arteriovenous malformation)   . Cataract   . Hypertension   . Sinoatrial node dysfunction     s/p PPM  . Stroke 2008    A-Fib   Past Surgical History  Procedure Laterality Date  . Cholecystectomy open    . Knee arthroscopy    . Tonsillectomy    . Pacemaker insertion  11/15/07    St. Jude Dr. Dannielle Karvonen implanted by Dr Graciela Husbands  . Tee without cardioversion  11/27/2011    Procedure: TRANSESOPHAGEAL ECHOCARDIOGRAM (TEE);  Surgeon: Pricilla Riffle, MD;  Location: [facility] ENDOSCOPY;  Service: Cardiovascular;  Laterality: N/A;  . Atrial fibrillation ablation  11/28/11 and 05/28/12    Afib ablation x 2 by Dr Johney Frame  . Tee without cardioversion  05/28/2012    Procedure: TRANSESOPHAGEAL ECHOCARDIOGRAM (TEE);  Surgeon: Laurey Morale, MD;  Location: [facility] ENDOSCOPY;  Service: Cardiovascular;  Laterality: N/A;  [name]     Current Outpatient Prescriptions  Medication Sig Dispense Refill  . albuterol (PROVENTIL HFA;VENTOLIN HFA) 108 (90 BASE) MCG/ACT inhaler Inhale 2 puffs  into the lungs every 4 (four) hours as needed for wheezing or shortness of breath.  1 Inhaler  prn  . ANDROGEL 50 MG/5GM GEL APPLY 1 PACKET TO SKIN DAILY  150 Package  0  . dabigatran (PRADAXA) 150 MG CAPS Take 150 mg by mouth 2 (two) times daily.       Marland Kitchen ezetimibe (ZETIA) 10 MG tablet Take 1 tablet (10 mg total) by mouth daily.  90 tablet  1  . fenofibrate 160 MG tablet Take 1 tablet (160 mg total) by mouth daily.  90 tablet  1  . fluvoxaMINE (LUVOX) 100 MG tablet Take 1 tablet (100 mg total) by mouth at bedtime.  90 tablet  1  . glucosamine-chondroitin 500-400 MG tablet Take 1 tablet by mouth 2 (two) times daily.        Marland Kitchen losartan (COZAAR) 50 MG tablet TAKE 1 TABLET BY MOUTH DAILY.  30 tablet  5  . metoprolol tartrate (LOPRESSOR) 25 MG tablet Take 25 mg by mouth 2 (two) times daily.      . metoprolol tartrate (LOPRESSOR) 25 MG tablet TAKE ONE TABLET TWO TIMES A DAY  60 tablet  6  . mometasone (NASONEX) 50 MCG/ACT nasal spray 1-2 puffs each nostril once every night at bedtime  17 g  prn  . Multiple Vitamin (MULITIVITAMIN WITH MINERALS) TABS Take 1 tablet by mouth daily.      . Omega-3 Fatty Acids (FISH  OIL) 1000 MG CAPS Take 1,000 mg by mouth 2 (two) times daily.       Marland Kitchen omeprazole (PRILOSEC) 40 MG capsule Take 40 mg by mouth daily.      Marland Kitchen omeprazole (PRILOSEC) 40 MG capsule TAKE ONE CAPSULE BY MOUTH EVERY DAY  30 capsule  5  . propafenone (RYTHMOL SR) 225 MG 12 hr capsule TAKE 1 CAPSULE (225 MG TOTAL) BY MOUTH 2 (TWO) TIMES DAILY.  60 capsule  6  . rOPINIRole (REQUIP) 3 MG tablet Take 1 tablet (3 mg total) by mouth at bedtime.  30 tablet  0  . rOPINIRole (REQUIP) 3 MG tablet TAKE (1) ONE TABLET DAILY.  30 tablet  1  . WELCHOL 625 MG tablet TAKE 3 TABLETS (1,875 MG TOTAL) BY MOUTH 2 (TWO) TIMES DAILY WITH A MEAL.  180 tablet  1  . sildenafil (VIAGRA) 100 MG tablet 1/2 tablet as directed  10 tablet  0   No current facility-administered medications for this visit.    Allergies  Allergen  Reactions  . Niacin Itching  . Statins     Liver damage    History   Social History  . Marital Status: Married    Spouse Name: Darl Pikes    Number of Children: 1  . Years of Education: 16+   Occupational History  . Geophysicist/field seismologist   .    Marland Kitchen     Social History Main Topics  . Smoking status: Never Smoker   . Smokeless tobacco: Never Used  . Alcohol Use: Yes     Comment: social  . Drug Use: No  . Sexually Active: Yes -- Male partner(s)   Other Topics Concern  . Not on file   Social History Narrative   Lives with his wife, in [place].  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  Enjoys scuba diving.    Family History  Problem Relation Age of Onset  . Sleep apnea Brother   . Stroke Mother   . Heart attack Father     ROS-  All systems are reviewed and are negative except as outlined in the HPI above   Physical Exam: Filed Vitals:   09/23/12 1436  BP: 144/68  Pulse: 60  Height: 5' 9.5" (1.765 m)  Weight: 212 lb (96.163 kg)    GEN- The patient is well appearing, alert and oriented x 3 today.   Head- normocephalic, atraumatic Eyes-  Sclera clear, conjunctiva pink Ears- hearing intact Oropharynx- clear Neck- supple, no JVP Lymph- no cervical lymphadenopathy Lungs- Clear to ausculation bilaterally, normal work of breathing Chest- pacemaker pocket is well healed Heart- Regular rate and rhythm, no murmurs, rubs or gallops, PMI not laterally displaced GI- soft, NT, ND, + BS Extremities- no clubbing, cyanosis, or edema Neuro- strength and sensation are intact  Pacemaker interrogation- reviewed in detail today,  See PACEART report  Assessment and Plan:  1, Bradycardia Normal pacemaker function See Pace Art report No changes today  2. Afib Much improved No changes at this time  Return for follow-up in 3 months

## 2012-10-25 ENCOUNTER — Other Ambulatory Visit: Payer: Self-pay | Admitting: Physician Assistant

## 2012-10-26 ENCOUNTER — Other Ambulatory Visit: Payer: Self-pay | Admitting: Physician Assistant

## 2012-10-28 ENCOUNTER — Telehealth: Payer: Self-pay | Admitting: Internal Medicine

## 2012-10-28 ENCOUNTER — Other Ambulatory Visit: Payer: Self-pay | Admitting: Physician Assistant

## 2012-10-28 NOTE — Telephone Encounter (Signed)
lmomtcb for pt 

## 2012-10-29 NOTE — Telephone Encounter (Signed)
This is not one of my regular patients. I have not seen him in the last year. He is seen [name] and 2 or 3 of the other providers. It looks like he needs to come in and be seen and have lab work done . I think it would be fine to give him a one-month renewal but I'm not sure who wrote the last prescription. He needs to make an appointment to be seen at 104 so his blood work can be done.

## 2012-10-29 NOTE — Telephone Encounter (Signed)
Wife advised, he is due for office visit and labs. Rx called in. He will make appt with [name]

## 2012-10-29 NOTE — Telephone Encounter (Signed)
Spoke with patient-aware to come in on Wednesday 12-04-12 at 11:00am for 11:15am appointment with CY.

## 2012-10-29 NOTE — Telephone Encounter (Signed)
Pt returned call & would like to be seen the last week in May, please.  Antionette Fairy

## 2012-10-29 NOTE — Telephone Encounter (Signed)
[id number]

## 2012-10-29 NOTE — Telephone Encounter (Signed)
[name], before we even call him back, can you please advise if there is any way he can be worked in last wk of May? Please advise thanks!

## 2012-11-02 ENCOUNTER — Other Ambulatory Visit: Payer: Self-pay | Admitting: Physician Assistant

## 2012-11-04 ENCOUNTER — Ambulatory Visit (INDEPENDENT_AMBULATORY_CARE_PROVIDER_SITE_OTHER): Payer: Medicare Other | Admitting: Physician Assistant

## 2012-11-04 VITALS — BP 142/94 | HR 68 | Temp 97.9°F | Resp 16 | Ht 69.5 in | Wt 197.0 lb

## 2012-11-04 DIAGNOSIS — E782 Mixed hyperlipidemia: Secondary | ICD-10-CM

## 2012-11-04 DIAGNOSIS — I10 Essential (primary) hypertension: Secondary | ICD-10-CM

## 2012-11-04 DIAGNOSIS — K219 Gastro-esophageal reflux disease without esophagitis: Secondary | ICD-10-CM

## 2012-11-04 DIAGNOSIS — E291 Testicular hypofunction: Secondary | ICD-10-CM

## 2012-11-04 LAB — POCT CBC
HCT, POC: 44.9 % (ref 43.5–53.7)
Lymph, poc: 0.9 (ref 0.6–3.4)
MCHC: 33 g/dL (ref 31.8–35.4)
MID (cbc): 0.3 (ref 0–0.9)
POC Granulocyte: 3.3 (ref 2–6.9)
POC LYMPH PERCENT: 20.9 %L (ref 10–50)
Platelet Count, POC: 134 10*3/uL — AB (ref 142–424)
RDW, POC: 13.5 %

## 2012-11-04 LAB — COMPREHENSIVE METABOLIC PANEL
AST: 19 U/L (ref 0–37)
Alkaline Phosphatase: 58 U/L (ref 39–117)
BUN: 18 mg/dL (ref 6–23)
Glucose, Bld: 94 mg/dL (ref 70–99)
Sodium: 140 mEq/L (ref 135–145)
Total Bilirubin: 0.6 mg/dL (ref 0.3–1.2)
Total Protein: 6.8 g/dL (ref 6.0–8.3)

## 2012-11-04 LAB — LIPID PANEL
HDL: 39 mg/dL — ABNORMAL LOW (ref >39)
LDL Cholesterol: 121 mg/dL — ABNORMAL HIGH (ref 0–99)
Triglycerides: 304 mg/dL — ABNORMAL HIGH (ref <150)
VLDL: 61 mg/dL — ABNORMAL HIGH (ref 0–40)

## 2012-11-04 MED ORDER — EZETIMIBE 10 MG PO TABS: 10.0000 mg | ORAL_TABLET | Freq: Every day | ORAL | Status: DC

## 2012-11-04 MED ORDER — COLESEVELAM HCL 625 MG PO TABS: 1875.0000 mg | ORAL_TABLET | Freq: Two times a day (BID) | ORAL | Status: DC

## 2012-11-04 MED ORDER — FENOFIBRATE 160 MG PO TABS: 160.0000 mg | ORAL_TABLET | Freq: Every day | ORAL | Status: DC

## 2012-11-04 MED ORDER — TESTOSTERONE 20.25 MG/ACT (1.62%) TD GEL: 2.0000 | Freq: Every day | TRANSDERMAL | Status: DC

## 2012-11-04 NOTE — Progress Notes (Signed)
Subjective:    Patient ID: [name], male    DOB: 1945-07-29, 67 y.o.   MRN: [id number]  HPI This 67 y.o. male presents for evaluation of HTN, elevated lipids, and hypogonadism.  He's working with a hypnotist and is pleased to have lost weight.  Still has about 20 pounds to lose to reach his goal.    Androgel is helping a little, but not much. He thinks the increased energy he has is due to the weight loss.  No recent AFib (last episode was triggered by alcohol consumption, so he generally avoids it altogether.  He and his wife are travelling to [place] soon to help her mother-their hope is to help her move to an assisted living facility, but are doubtful that she will agree to go.  At least they hope to do some "spring cleaning" at her property.  Current Outpatient Prescriptions on File Prior to Visit  Medication Sig Dispense Refill  . albuterol (PROVENTIL HFA;VENTOLIN HFA) 108 (90 BASE) MCG/ACT inhaler Inhale 2 puffs into the lungs every 4 (four) hours as needed for wheezing or shortness of breath.  1 Inhaler  prn  . dabigatran (PRADAXA) 150 MG CAPS Take 150 mg by mouth 2 (two) times daily.       . fluvoxaMINE (LUVOX) 100 MG tablet Take 1 tablet (100 mg total) by mouth at bedtime.  90 tablet  1  . glucosamine-chondroitin 500-400 MG tablet Take 1 tablet by mouth 2 (two) times daily.        Marland Kitchen losartan (COZAAR) 50 MG tablet TAKE 1 TABLET BY MOUTH DAILY.  30 tablet  5  . metoprolol tartrate (LOPRESSOR) 25 MG tablet TAKE ONE TABLET TWO TIMES A DAY  60 tablet  6  . mometasone (NASONEX) 50 MCG/ACT nasal spray 1-2 puffs each nostril once every night at bedtime  17 g  prn  . Multiple Vitamin (MULITIVITAMIN WITH MINERALS) TABS Take 1 tablet by mouth daily.      . Omega-3 Fatty Acids (FISH OIL) 1000 MG CAPS Take 1,000 mg by mouth 2 (two) times daily.       Marland Kitchen omeprazole (PRILOSEC) 40 MG capsule TAKE ONE CAPSULE BY MOUTH EVERY DAY  30 capsule  5  . propafenone (RYTHMOL SR) 225 MG 12 hr capsule  TAKE 1 CAPSULE (225 MG TOTAL) BY MOUTH 2 (TWO) TIMES DAILY.  60 capsule  6  . rOPINIRole (REQUIP) 3 MG tablet TAKE 1 TABLET BY MOUTH EVERY DAY  30 tablet  1  . sildenafil (VIAGRA) 100 MG tablet 1/2 tablet as directed  10 tablet  0   No current facility-administered medications on file prior to visit.   Patient Active Problem List   Diagnosis Date Noted  . Cough 04/16/2012  . Sinoatrial node dysfunction   . Esophagus, Barrett's 07/26/2011  . Hernia, hiatal 07/26/2011  . Hypogonadism male 07/26/2011  . 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  . OTHER ANTERIOR PITUITARY DISORDERS 06/30/2010  . SYNCOPE 03/29/2010  . DYSPNEA ON EXERTION 01/03/2010  . Asthma, mild intermittent 07/23/2009  . C V A / STROKE 07/25/2007  . Seasonal and perennial allergic rhinitis 07/25/2007  . HYPERLIPIDEMIA 07/24/2007  . OBSTRUCTIVE SLEEP APNEA 07/24/2007  . RESTLESS LEGS SYNDROME 07/24/2007  . Atrial fibrillation 07/24/2007  . GERD 07/24/2007   Allergies  Allergen Reactions  . Niacin Itching  . Statins     Liver damage   Family and social history reviewed.   Review of Systems No chest pain, SOB, HA, dizziness, vision change,  N/V, diarrhea, constipation, dysuria, urinary urgency or frequency, myalgias, arthralgias or rash.     Objective:   Physical Exam  Blood pressure 142/94, pulse 68, temperature 97.9 F (36.6 C), temperature source Oral, resp. rate 16, height 5' 9.5" (1.765 m), weight 197 lb (89.359 kg), SpO2 96.00%. Body mass index is 28.68 kg/(m^2). Well-developed, well nourished WM who is awake, alert and oriented, in NAD. HEENT: [place]/AT, PERRL, EOMI.  Sclera and conjunctiva are clear.  EAC are patent, TMs are normal in appearance. Nasal mucosa is pink and moist. OP is clear. Neck: supple, non-tender, no lymphadenopathy, thyromegaly. Heart: RRR, no murmur Lungs: normal effort, CTA Abdomen: normo-active bowel sounds, supple, non-tender, no mass or organomegaly. Extremities: no cyanosis, clubbing or  edema. Skin: warm and dry without rash. Psychologic: good mood and appropriate affect, normal speech and behavior.       Assessment & Plan:  Mixed hyperlipidemia - Plan: ezetimibe (ZETIA) 10 MG tablet, fenofibrate 160 MG tablet, colesevelam (WELCHOL) 625 MG tablet, Comprehensive metabolic panel, Lipid panel  HTN (hypertension) - Plan: POCT CBC, Comprehensive metabolic panel  Hypogonadism male - Plan: Testosterone 20.25 MG/ACT (1.62%) GEL, Testosterone  RTC 6 months, sooner PRN.  He'll see pulmonology and cardiology in the interim.  Fernande Bras, PA-C Physician Assistant-Certified [facility]

## 2012-11-18 ENCOUNTER — Ambulatory Visit: Payer: Medicare Other | Admitting: Internal Medicine

## 2012-12-04 ENCOUNTER — Ambulatory Visit: Payer: Medicare Other | Admitting: Internal Medicine

## 2012-12-05 ENCOUNTER — Ambulatory Visit: Payer: BC Managed Care – PPO | Admitting: Physician Assistant

## 2012-12-05 IMAGING — CT CT ABD-PELV W/O CM
2 of 4 series · 17 of 46 positions shown, 19 images · non-contrast
Comparison: None.

CLINICAL DATA: Left flank pain.

CT ABDOMEN AND PELVIS WITHOUT CONTRAST
TECHNIQUE: Multidetector CT imaging of the abdomen and pelvis was
performed following the standard protocol without intravenous
contrast.

[Series 2: renal stone · axial · 0.77mm/px · z∈[-514,-89]mm · 14 of 94 slices shown, 16 images]
[im 5/94  soft-tissue]
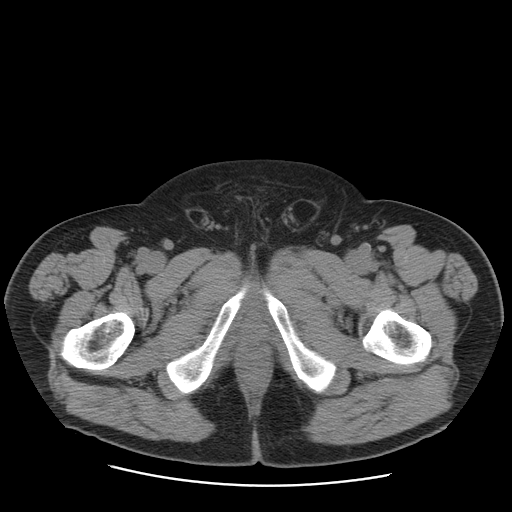
[im 5/94  bone]
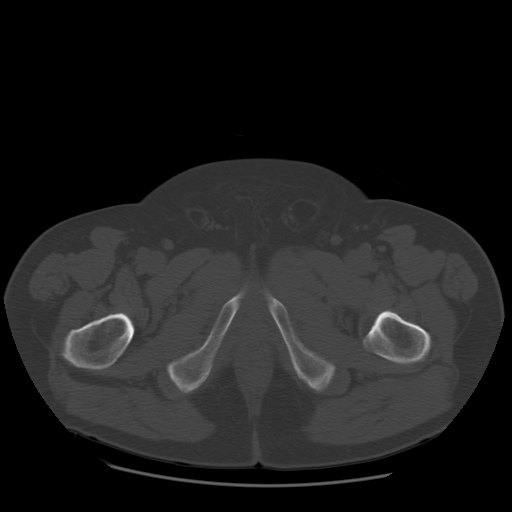
[im 13/94  soft-tissue]
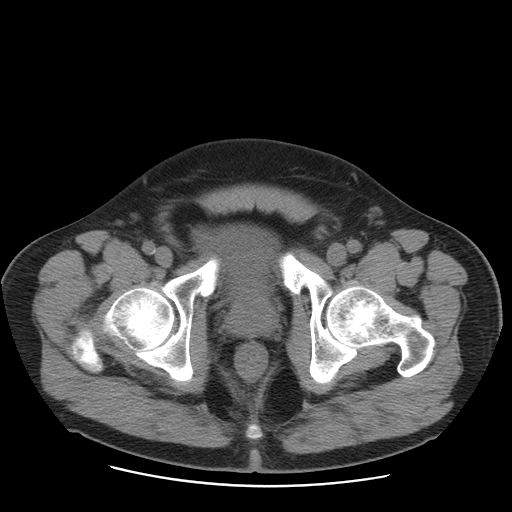
[im 17/94  soft-tissue]
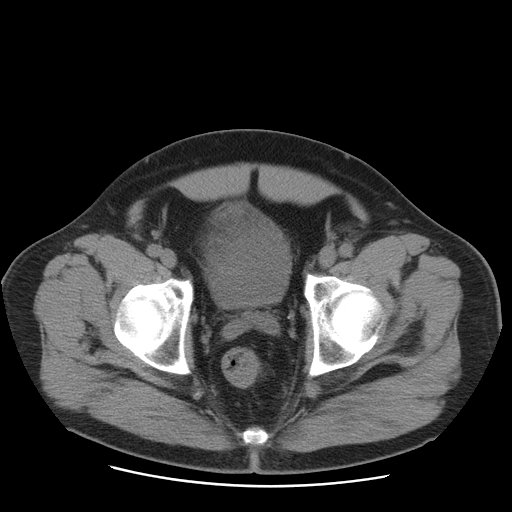
[im 25/94  soft-tissue]
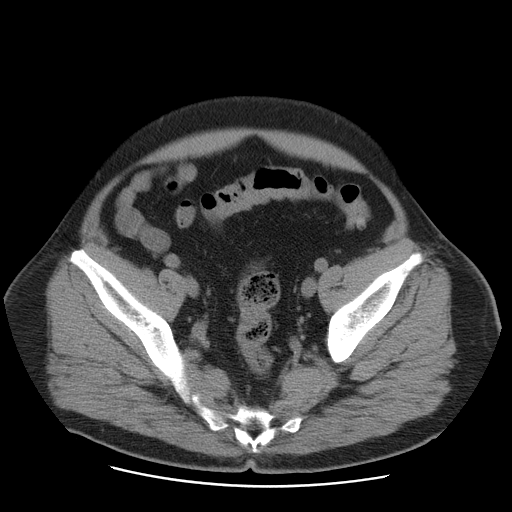
[im 33/94  soft-tissue]
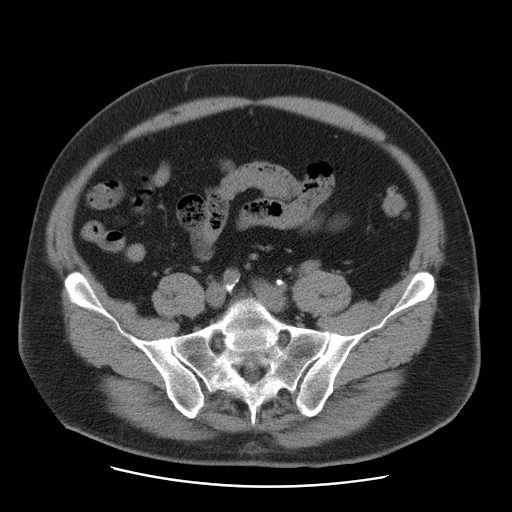
[im 37/94  soft-tissue]
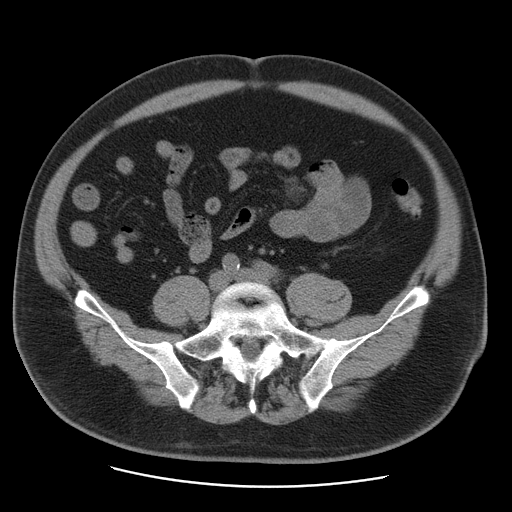
[im 45/94  soft-tissue]
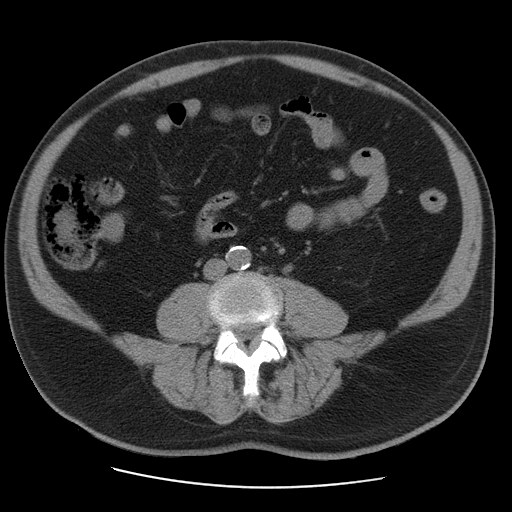
[im 49/94  soft-tissue]
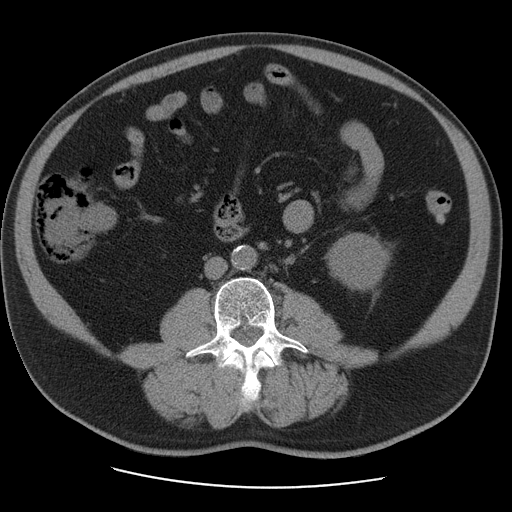
[im 57/94  soft-tissue]
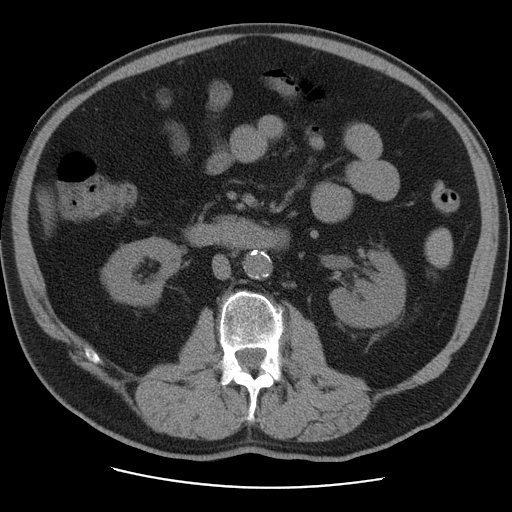
[im 57/94  bone]
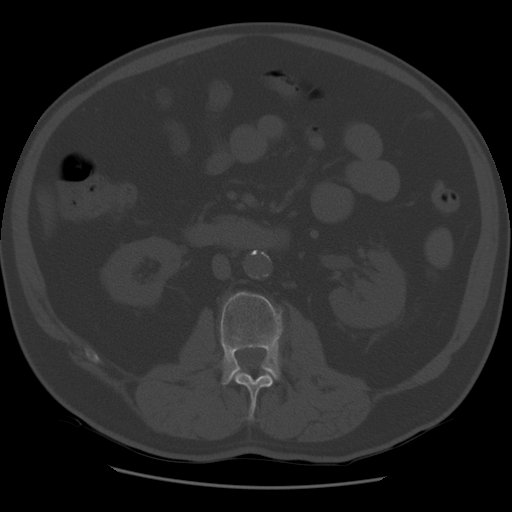
[im 61/94  soft-tissue]
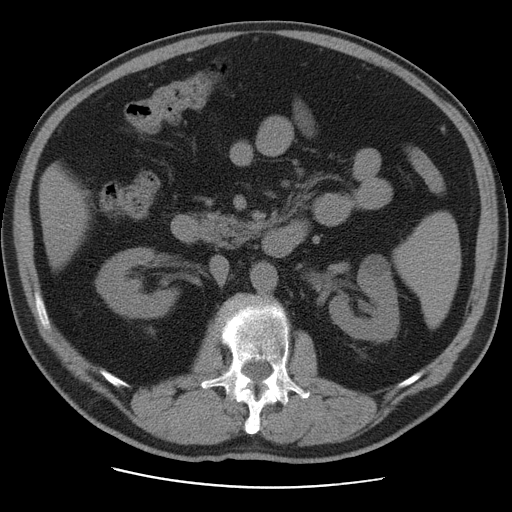
[im 69/94  soft-tissue]
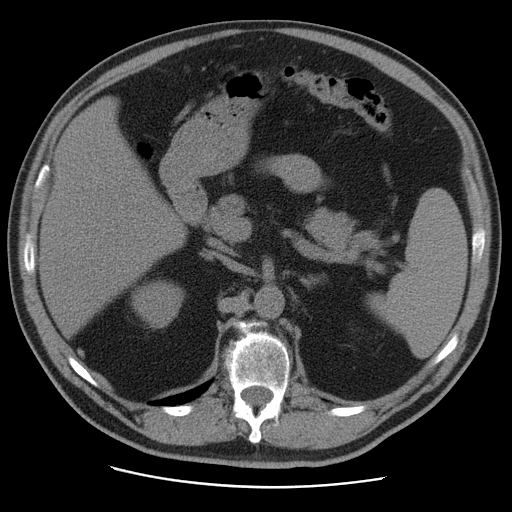
[im 77/94  soft-tissue]
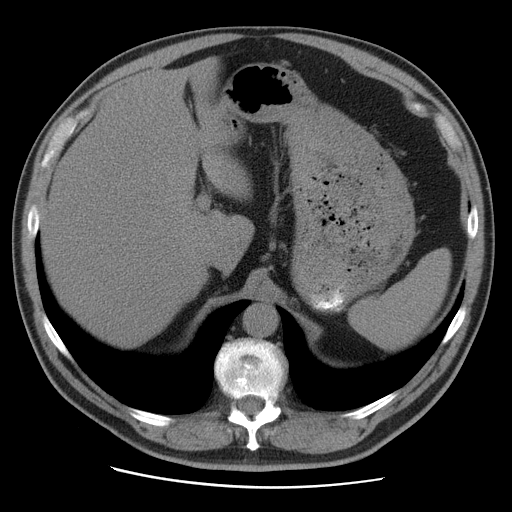
[im 81/94  soft-tissue]
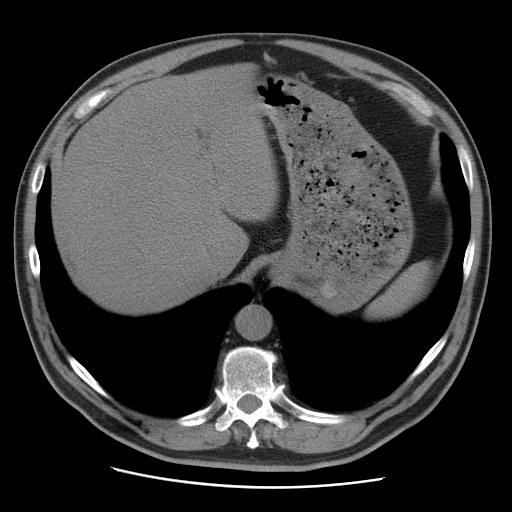
[im 89/94  soft-tissue]
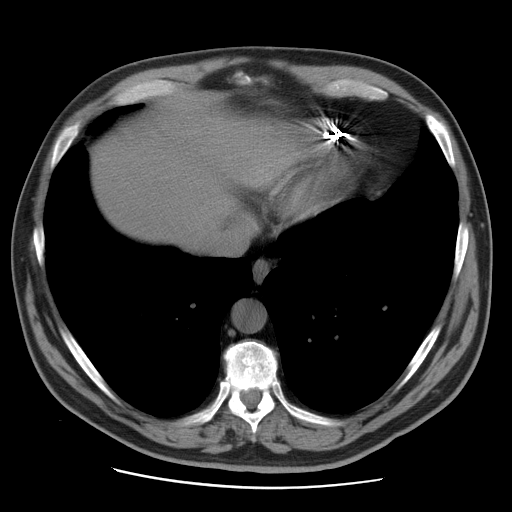

[Series 400: coronals · coronal · 0.94mm/px · 3 of 110 slices shown]
[im 37/110  soft-tissue]
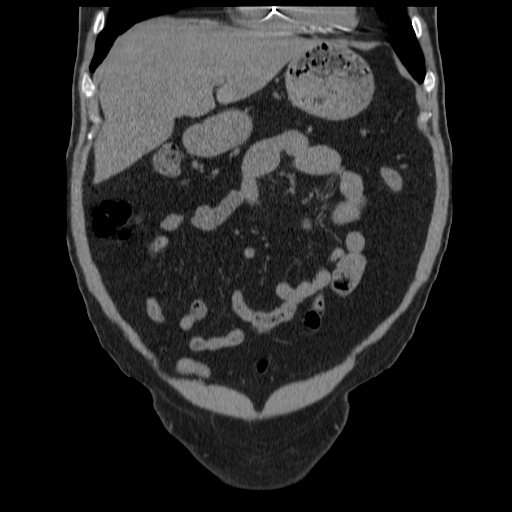
[im 49/110  soft-tissue]
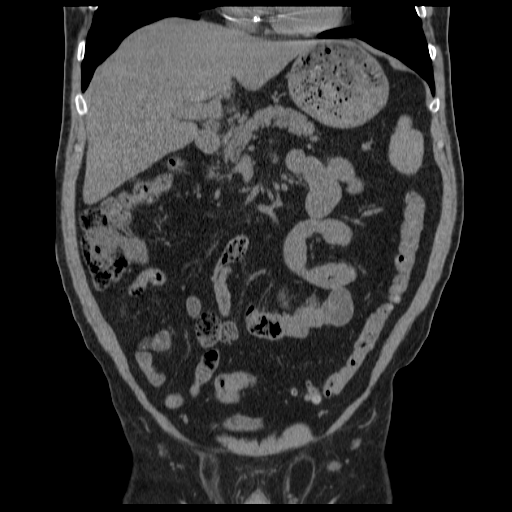
[im 61/110  soft-tissue]
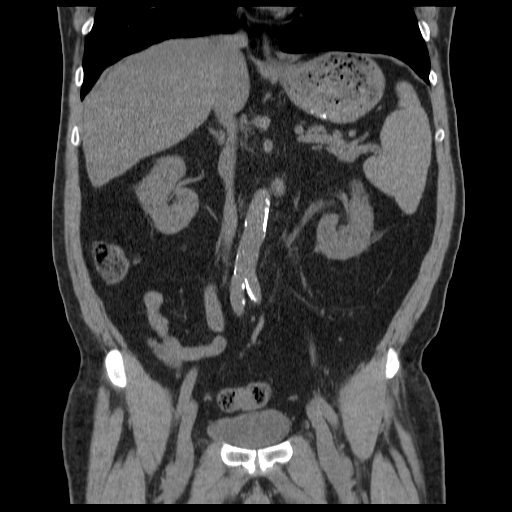

[17 of 46 positions shown; findings below may reference images not displayed]

FINDINGS: Pacer wires are noted in the right side of the heart.
Heart is normal size.  Lung bases clear.  No effusions.

Mild left hydronephrosis and perinephric stranding due to a 4 x 3
mm proximal to mid left ureteral stones.  No additional ureteral
stones.  No hydronephrosis or stones on the right.

Prior cholecystectomy.  Liver, spleen, pancreas, adrenals, stomach
unremarkable.  Descending colonic and sigmoid diverticula without
diverticulitis.  Small bowel is decompressed.  Appendix is
visualized and is normal.  No free fluid, free air or adenopathy.
Small bilateral inguinal hernias containing fat.  Aorta is
calcified, non-aneurysmal.
IMPRESSION: 4 x 3 mm proximal to mid left ureteral stone with mild left
hydronephrosis.

Descending colonic and sigmoid diverticulosis.

## 2012-12-12 ENCOUNTER — Other Ambulatory Visit: Payer: Self-pay | Admitting: *Deleted

## 2012-12-12 MED ORDER — DABIGATRAN ETEXILATE MESYLATE 150 MG PO CAPS: 150.0000 mg | ORAL_CAPSULE | Freq: Two times a day (BID) | ORAL | Status: DC

## 2012-12-17 ENCOUNTER — Other Ambulatory Visit: Payer: Self-pay

## 2012-12-17 MED ORDER — DABIGATRAN ETEXILATE MESYLATE 150 MG PO CAPS: 150.0000 mg | ORAL_CAPSULE | Freq: Two times a day (BID) | ORAL | Status: DC

## 2012-12-17 NOTE — Progress Notes (Signed)
PA approved for Androgel through 12/16/17. Faxed pharmacy.

## 2012-12-30 ENCOUNTER — Encounter: Payer: Self-pay | Admitting: Internal Medicine

## 2012-12-30 ENCOUNTER — Telehealth: Payer: Self-pay | Admitting: Internal Medicine

## 2012-12-30 ENCOUNTER — Ambulatory Visit (INDEPENDENT_AMBULATORY_CARE_PROVIDER_SITE_OTHER): Payer: Medicare Other | Admitting: Internal Medicine

## 2012-12-30 ENCOUNTER — Other Ambulatory Visit: Payer: Self-pay | Admitting: Physician Assistant

## 2012-12-30 VITALS — BP 160/91 | HR 64 | Ht 69.5 in | Wt 194.0 lb

## 2012-12-30 DIAGNOSIS — I495 Sick sinus syndrome: Secondary | ICD-10-CM

## 2012-12-30 DIAGNOSIS — Z950 Presence of cardiac pacemaker: Secondary | ICD-10-CM

## 2012-12-30 DIAGNOSIS — I4891 Unspecified atrial fibrillation: Secondary | ICD-10-CM

## 2012-12-30 DIAGNOSIS — E782 Mixed hyperlipidemia: Secondary | ICD-10-CM

## 2012-12-30 LAB — PACEMAKER DEVICE OBSERVATION
AL AMPLITUDE: 2.3 mv
BATTERY VOLTAGE: 2.79 V
RV LEAD IMPEDENCE PM: 437 Ohm
VENTRICULAR PACING PM: 1

## 2012-12-30 MED ORDER — OMEPRAZOLE 40 MG PO CPDR: 40.0000 mg | DELAYED_RELEASE_CAPSULE | Freq: Every day | ORAL | Status: DC

## 2012-12-30 MED ORDER — LOSARTAN POTASSIUM 50 MG PO TABS: 50.0000 mg | ORAL_TABLET | Freq: Every day | ORAL | Status: DC

## 2012-12-30 MED ORDER — DABIGATRAN ETEXILATE MESYLATE 150 MG PO CAPS: 150.0000 mg | ORAL_CAPSULE | Freq: Two times a day (BID) | ORAL | Status: DC

## 2012-12-30 NOTE — Progress Notes (Signed)
PCP:  Lucilla Edin, MD Primary EP:  Dr Graciela Husbands  The patient presents today for routine electrophysiology followup.  Since his last visit, the patient reports doing very well.  He has had no afib since his last visit.  Today, he denies symptoms of chest pain, shortness of breath, orthopnea, PND, lower extremity edema, dizziness, presyncope, syncope, or neurologic sequela.  The patient feels that he is tolerating medications without difficulties and is otherwise without complaint today.   Past Medical History  Diagnosis Date  . Atrial fibrillation   . OSA (obstructive sleep apnea)     on CPAP  . Allergic rhinitis   . Hyperlipidemia   . Asthma   . GERD (gastroesophageal reflux disease)   . Barrett's esophagus 03/1993  . RLS (restless legs syndrome)   . Tubular adenoma polyp of rectum 07/1998  . Hiatal hernia   . Hemorrhoids   . AVM (arteriovenous malformation)   . Cataract   . Hypertension   . Sinoatrial node dysfunction     s/p PPM  . Stroke 2008    A-Fib   Past Surgical History  Procedure Laterality Date  . Cholecystectomy open    . Knee arthroscopy    . Tonsillectomy    . Pacemaker insertion  11/15/07    St. Jude Dr. Dannielle Karvonen implanted by Dr Graciela Husbands  . Tee without cardioversion  11/27/2011    Procedure: TRANSESOPHAGEAL ECHOCARDIOGRAM (TEE);  Surgeon: Pricilla Riffle, MD;  Location: [facility] ENDOSCOPY;  Service: Cardiovascular;  Laterality: N/A;  . Atrial fibrillation ablation  11/28/11 and 05/28/12    Afib ablation x 2 by Dr Johney Frame  . Tee without cardioversion  05/28/2012    Procedure: TRANSESOPHAGEAL ECHOCARDIOGRAM (TEE);  Surgeon: Laurey Morale, MD;  Location: [facility] ENDOSCOPY;  Service: Cardiovascular;  Laterality: N/A;  [name]     Current Outpatient Prescriptions  Medication Sig Dispense Refill  . albuterol (PROVENTIL HFA;VENTOLIN HFA) 108 (90 BASE) MCG/ACT inhaler Inhale 2 puffs into the lungs every 4 (four) hours as needed for wheezing or shortness of breath.  1 Inhaler  prn  .  colesevelam (WELCHOL) 625 MG tablet Take 3 tablets (1,875 mg total) by mouth 2 (two) times daily with a meal.  540 tablet  1  . dabigatran (PRADAXA) 150 MG CAPS Take 1 capsule (150 mg total) by mouth 2 (two) times daily.  60 capsule  1  . ezetimibe (ZETIA) 10 MG tablet Take 1 tablet (10 mg total) by mouth daily.  90 tablet  1  . fenofibrate 160 MG tablet Take 1 tablet (160 mg total) by mouth daily.  90 tablet  1  . fluvoxaMINE (LUVOX) 100 MG tablet Take 1 tablet (100 mg total) by mouth at bedtime.  90 tablet  1  . glucosamine-chondroitin 500-400 MG tablet Take 1 tablet by mouth 2 (two) times daily.        Marland Kitchen losartan (COZAAR) 50 MG tablet TAKE 1 TABLET BY MOUTH DAILY.  30 tablet  5  . metoprolol tartrate (LOPRESSOR) 25 MG tablet TAKE ONE TABLET TWO TIMES A DAY  60 tablet  6  . mometasone (NASONEX) 50 MCG/ACT nasal spray 1-2 puffs each nostril once every night at bedtime  17 g  prn  . Multiple Vitamin (MULITIVITAMIN WITH MINERALS) TABS Take 1 tablet by mouth daily.      . Omega-3 Fatty Acids (FISH OIL) 1000 MG CAPS Take 1,000 mg by mouth 2 (two) times daily.       Marland Kitchen omeprazole (PRILOSEC)  40 MG capsule TAKE ONE CAPSULE BY MOUTH EVERY DAY  30 capsule  5  . propafenone (RYTHMOL SR) 225 MG 12 hr capsule TAKE 1 CAPSULE (225 MG TOTAL) BY MOUTH 2 (TWO) TIMES DAILY.  60 capsule  6  . rOPINIRole (REQUIP) 3 MG tablet TAKE 1 TABLET BY MOUTH EVERY DAY  30 tablet  1  . sildenafil (VIAGRA) 100 MG tablet 1/2 tablet as directed  10 tablet  0  . Testosterone 20.25 MG/ACT (1.62%) GEL Place 2 Act onto the skin daily. 1 pump actuations to each axilla 1 time each day.  75 g  5   No current facility-administered medications for this visit.    Allergies  Allergen Reactions  . Niacin Itching  . Statins     Liver damage    History   Social History  . Marital Status: Married    Spouse Name: Darl Pikes    Number of Children: 1  . Years of Education: 16+   Occupational History  . Geophysicist/field seismologist   .     Marland Kitchen     Social History Main Topics  . Smoking status: Never Smoker   . Smokeless tobacco: Never Used  . Alcohol Use: Yes     Comment: social  . Drug Use: No  . Sexually Active: Yes -- Male partner(s)   Other Topics Concern  . Not on file   Social History Narrative   Lives with his wife, in [place].  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  Enjoys scuba diving. [place].  [place] and [place] community in [place].    Family History  Problem Relation Age of Onset  . Sleep apnea Brother   . Stroke Mother   . Heart attack Father   . Heart disease Father   . Heart attack Sister   . Ulcers Son 5  . Heart disease Paternal Grandmother     ROS-  All systems are reviewed and are negative except as outlined in the HPI above   Physical Exam: Filed Vitals:   12/30/12 0926  BP: 160/91  Pulse: 64  Height: 5' 9.5" (1.765 m)  Weight: 194 lb (87.998 kg)    GEN- The patient is well appearing, alert and oriented x 3 today.   Head- normocephalic, atraumatic Eyes-  Sclera clear, conjunctiva pink Ears- hearing intact Oropharynx- clear Neck- supple, no JVP Lymph- no cervical lymphadenopathy Lungs- Clear to ausculation bilaterally, normal work of breathing Chest- pacemaker pocket is well healed Heart- Regular rate and rhythm, no murmurs, rubs or gallops, PMI not laterally displaced GI- soft, NT, ND, + BS Extremities- no clubbing, cyanosis, or edema Neuro- strength and sensation are intact  Pacemaker interrogation- reviewed in detail today,  See PACEART report  Assessment and Plan:  1, Bradycardia Normal pacemaker function See Pace Art report No changes today  2. Afib Maintaining sinus rhythm post ablation Stop rhythmol Continue pradaxa  3. HTN Repeat BP by MD is 152/80 Continue weight  loss and diet restriction He will check his BP at home and contact our office if BP remains elevated  Return for follow-up in 6 months If still in sinus, will return care to Dr Graciela Husbands at that point

## 2012-12-30 NOTE — Telephone Encounter (Signed)
Called, spoke with pt. CDY had cancellation on tomorrow. We have scheduled pt to see him on tomorrow, June 24 at 3:30 pm and have cancelled the July appt. Pt aware and voiced no further questions or concerns at this time.

## 2012-12-30 NOTE — Patient Instructions (Addendum)
Your physician wants you to follow-up in: 6 months with Dr Johney Frame.  You will receive a reminder letter in the mail two months in advance. If you don't receive a letter, please call our office to schedule the follow-up appointment.  Your physician has recommended you make the following change in your medication:  1) Stop Rhythmol

## 2012-12-31 ENCOUNTER — Ambulatory Visit (INDEPENDENT_AMBULATORY_CARE_PROVIDER_SITE_OTHER): Payer: Medicare Other | Admitting: Internal Medicine

## 2012-12-31 ENCOUNTER — Encounter: Payer: Self-pay | Admitting: Internal Medicine

## 2012-12-31 VITALS — BP 124/78 | HR 63 | Ht 69.0 in | Wt 196.6 lb

## 2012-12-31 DIAGNOSIS — J4520 Mild intermittent asthma, uncomplicated: Secondary | ICD-10-CM

## 2012-12-31 DIAGNOSIS — J45909 Unspecified asthma, uncomplicated: Secondary | ICD-10-CM

## 2012-12-31 DIAGNOSIS — G4733 Obstructive sleep apnea (adult) (pediatric): Secondary | ICD-10-CM

## 2012-12-31 NOTE — Progress Notes (Signed)
Subjective:    Patient ID: [name], male    DOB: Dec 20, 1945, 67 y.o.   MRN: [id number]  HPI 11/16/10-65 yoM with OSA and asthma, complicated by atrial fib/ pacemaker and hx CVA.  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 Since then he got through the winter well.  He denies dyspnea. A few wheeze-like sensations do not respond to rescue inhaler. Admits some nasal congestion. He used his Nasonex for a few days. CPAP continues all night every night at 7/ [facility]. He has been tempted to try a bit higher.   11/16/11- 65 yoM with OSA and asthma, complicated by atrial fib/ pacemaker and hx CVA.  Scuba diver Stomach sleeper but able to readjust to be able to use CPAP every night Not diving much until weather warms up. Pending ablation/Dr Alred Using CPAP all night every night 8/[facility]. Wife, and this wheeze in the mornings. He doesn't notice  12/31/12- 67 yoM with OSA and asthma, complicated by atrial fib/ pacemaker and hx CVA.  Scuba diver FOLLOWS FOR: wears CPAP 8/ [facility] every night for about 6-8 hours; pressure works well for patient; gets dry mouth Still active diver despite his history of atrial fibrillation/pacemaker. Has gone to a hypnotist to lose weight. Nasonex has worked well. Has not needed his rescue inhaler.  ROS-see HPI Constitutional:   No-   weight loss, night sweats, fevers, chills, fatigue, lassitude. HEENT:   No-  headaches, difficulty swallowing, tooth/dental problems, sore throat,       No-  sneezing, itching, ear ache, +nasal congestion, post nasal drip,  CV:  No-   chest pain, orthopnea, PND, swelling in lower extremities, anasarca, dizziness, palpitations Resp: No-   shortness of breath with exertion or at rest.              No-   productive cough,  No non-productive cough,  No- coughing up of blood.              No-   change in color of mucus.  No- wheezing.   Skin: No-   rash or lesions. GI:  No-   heartburn, indigestion, abdominal pain,  nausea, vomiting,  GU:  MS:  No-   joint pain or swelling.   Neuro-     nothing unusual Psych:  No- change in mood or affect. No depression or anxiety.  No memory loss.  OBJ- Physical Exam General- Alert, Oriented, Affect-appropriate, Distress- none acute. Fit appearing. Skin- rash-none, lesions- none, excoriation- none Lymphadenopathy- none Head- atraumatic            Eyes- Gross vision intact, PERRLA, conjunctivae and secretions clear            Ears- Hearing, canals-normal            Nose- Clear, no-Septal dev, mucus, polyps, erosion, perforation             Throat- Mallampati II , mucosa clear , drainage+mucoid, tonsils- atrophic Neck- flexible , trachea midline, no stridor , thyroid nl, carotid no bruit Chest - symmetrical excursion , unlabored           Heart/CV- RRR/paced , no murmur , no gallop  , no rub, nl s1 s2                           - JVD- none , edema- none, stasis changes- none, varices- none  Lung- clear to P&A, wheeze- none, cough- none , dullness-none, rub- none           Chest wall- pacemaker L Abd-  Br/ Gen/ Rectal- Not done, not indicated Extrem- cyanosis- none, clubbing, none, atrophy- none, strength- nl Neuro- grossly intact to observation Assessment & Plan:

## 2012-12-31 NOTE — Patient Instructions (Addendum)
We can continue CPAP 8/ [facility]  Consider trying otc Biotene for dry mouth if you want.

## 2013-01-01 ENCOUNTER — Telehealth: Payer: Self-pay | Admitting: Internal Medicine

## 2013-01-01 NOTE — Telephone Encounter (Signed)
Pharmacy has Rx, it's just to soon to fill medication

## 2013-01-01 NOTE — Telephone Encounter (Signed)
Follow Up    Pt following up on a new prescription (PRADAXA). Pt states pharmacy did not receive script. Please call.

## 2013-01-14 NOTE — Assessment & Plan Note (Signed)
Very mild intermittent asthma has not been a problem with his scuba diving, but I have cautioned him.

## 2013-01-14 NOTE — Assessment & Plan Note (Signed)
Good compliance and control with no changes required 

## 2013-01-24 ENCOUNTER — Ambulatory Visit: Payer: Medicare Other | Admitting: Internal Medicine

## 2013-02-12 ENCOUNTER — Other Ambulatory Visit: Payer: Self-pay

## 2013-02-26 ENCOUNTER — Other Ambulatory Visit: Payer: Self-pay | Admitting: Physician Assistant

## 2013-03-03 ENCOUNTER — Telehealth: Payer: Self-pay | Admitting: Internal Medicine

## 2013-03-03 ENCOUNTER — Telehealth: Payer: Self-pay

## 2013-03-03 ENCOUNTER — Ambulatory Visit (INDEPENDENT_AMBULATORY_CARE_PROVIDER_SITE_OTHER): Payer: Medicare Other | Admitting: Physician Assistant

## 2013-03-03 VITALS — BP 126/84 | HR 70 | Temp 98.3°F | Resp 16 | Ht 68.5 in | Wt 185.2 lb

## 2013-03-03 DIAGNOSIS — G4733 Obstructive sleep apnea (adult) (pediatric): Secondary | ICD-10-CM

## 2013-03-03 DIAGNOSIS — I4891 Unspecified atrial fibrillation: Secondary | ICD-10-CM

## 2013-03-03 DIAGNOSIS — I6789 Other cerebrovascular disease: Secondary | ICD-10-CM

## 2013-03-03 DIAGNOSIS — I10 Essential (primary) hypertension: Secondary | ICD-10-CM

## 2013-03-03 DIAGNOSIS — E291 Testicular hypofunction: Secondary | ICD-10-CM

## 2013-03-03 NOTE — Progress Notes (Signed)
  Subjective:    Patient ID: [name], male    DOB: [birth year], 67 y.o.   MRN: [id number]  HPI This 67 y.o. male presents for follow-up of his chronic medical problems and paperwork completion. He needs the Medical Report Form completed for the [facility].  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 He's been told that he'll have to have this form completed annually unless I write a letter stating it's not necessary.  While he was here, we contacted the DMV to learn what triggered his receipt of the form.  The customer service representative advised [place] that it was reported via his last DOT Medical Exam due to cataracts, OSA and AFib.  Patient Active Problem List   Diagnosis Date Noted  . Sinoatrial node dysfunction   . Esophagus, Barrett's 07/26/2011  . Hernia, hiatal 07/26/2011  . Hypogonadism male 07/26/2011  . 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  . SYNCOPE x 1 03/29/2010  . DYSPNEA ON EXERTION 01/03/2010  . Asthma, mild intermittent 07/23/2009  . C V A / STROKE (embolic, LEFT eye) 07/25/2007  . Seasonal and perennial allergic rhinitis 07/25/2007  . HYPERLIPIDEMIA 07/24/2007  . OBSTRUCTIVE SLEEP APNEA 07/24/2007  . RESTLESS LEGS SYNDROME 07/24/2007  . Atrial fibrillation 07/24/2007  . GERD 07/24/2007   Medications, allergies, Past medical history, surgical history, family history, social history and problem list reviewed.  He's very compliant with treatment and follow-up.  Syncope x 1 due to SA node dysfunction.  No events since pacemaker placement 11/15/2007. Afib resolved with ablation in 05/2012.  Reports consistent CPAP use, but has an old machine and no compliance report. Follows with pulmonology and cardiology regularly.    He and his wife are now jogging 6-10 miles/week, after completing a "[employer] program.  He's lost weight, down from his heaviest of 212, with a  goal to lose about 10 more pounds through healthy eating and regular exercise.  Review of Systems Denies chest pain, shortness of breath, HA, dizziness, vision change, nausea, vomiting, diarrhea, constipation, melena, hematochezia, dysuria, increased urinary urgency or frequency, increased hunger or thirst, unintentional weight change, unexplained myalgias or arthralgias, rash.     Objective:   Physical Exam Blood pressure 126/84, pulse 70, temperature 98.3 F (36.8 C), temperature source Oral, resp. rate 16, height 5' 8.5" (1.74 m), weight 185 lb 3.2 oz (84.006 kg), SpO2 97.00%. Body mass index is 27.75 kg/(m^2). Well-developed, well nourished WM who is awake, alert and oriented, in NAD. HEENT: [place]/AT, sclera and conjunctiva are clear.   Neck: supple, non-tender, no lymphadenopathy, thyromegaly. Heart: Regular rate Lungs: normal effort Extremities: no cyanosis, clubbing or edema. Skin: warm and dry without rash. Psychologic: good mood and appropriate affect, normal speech and behavior.        Assessment & Plan:  Atrial fibrillation - Regular rate/rhythm s/p ablation 05/2012  Obstructive sleep apnea (adult) - controlled on CPAP  Hypogonadism male - on testosterone replacement, stable  HTN (hypertension) - controlled  C V A / STROKE, embolic, LEFT eye - continue Pradaxa   See letter regarding safety with driving and need for continued Medical Report Forms.  Form marked for scanning.  Fernande Bras, PA-C Physician Assistant-Certified [facility]

## 2013-03-03 NOTE — Patient Instructions (Signed)
Call Dr. Maple Hudson and get a Compliance Report to show that you use your CPAP machine as you should. You may need to get a new machine (the newer one have this function, which is required now for [facility] Medical Examinations.  Your eye specialist needs to complete page 3 of the form.

## 2013-03-03 NOTE — Telephone Encounter (Signed)
Did he provide a Cpap compliance report? He does not have this. He needs order faxed to [facility] for this. Advised him I can send this for him. Order faxed

## 2013-03-03 NOTE — Telephone Encounter (Signed)
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 Please call him at [phone]

## 2013-03-03 NOTE — Telephone Encounter (Signed)
lmomtcb x1 

## 2013-03-04 NOTE — Telephone Encounter (Signed)
Spoke with the pt and notified order will be sent to [facility] Order sent to [facility]

## 2013-03-04 NOTE — Telephone Encounter (Signed)
Pt is requesting that an order be placed for a cpap compliance report be sent to [facility]. 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 Please advise.Carron Curie, CMA

## 2013-03-04 NOTE — Telephone Encounter (Signed)
Ok to please order DME [facility]- CPAP compliance download    Dx OSA     Report is needed for his work

## 2013-03-26 ENCOUNTER — Telehealth: Payer: Self-pay | Admitting: Radiology

## 2013-03-26 MED ORDER — TESTOSTERONE 30 MG/ACT TD SOLN: 2.0000 | Freq: Every day | TRANSDERMAL | Status: DC

## 2013-03-26 NOTE — Telephone Encounter (Signed)
Pharmacy is calling about the testosterone gel, the sig states put under arm, but strength given is not indicated for use under arm, please advise.

## 2013-03-26 NOTE — Telephone Encounter (Signed)
New Rx printed.  Meds ordered this encounter  Medications  . Testosterone 30 MG/ACT SOLN    Sig: Place 2 Act onto the skin daily.    Dispense:  90 mL    Refill:  0    Order Specific Question:  Supervising Provider    Answer:  DOOLITTLE, ROBERT P [[id number]]

## 2013-03-27 ENCOUNTER — Other Ambulatory Visit: Payer: Self-pay | Admitting: Radiology

## 2013-03-28 ENCOUNTER — Other Ambulatory Visit: Payer: Self-pay | Admitting: Physician Assistant

## 2013-03-29 NOTE — Telephone Encounter (Signed)
[name], you've seen the patient recently for chronic Dxs, but not specifically for this medication. Do you want to RF?

## 2013-04-06 ENCOUNTER — Other Ambulatory Visit: Payer: Self-pay | Admitting: Physician Assistant

## 2013-05-15 ENCOUNTER — Other Ambulatory Visit: Payer: Self-pay

## 2013-05-27 ENCOUNTER — Other Ambulatory Visit: Payer: Self-pay | Admitting: Emergency Medicine

## 2013-05-29 ENCOUNTER — Other Ambulatory Visit: Payer: Self-pay | Admitting: Physician Assistant

## 2013-05-30 ENCOUNTER — Other Ambulatory Visit: Payer: Self-pay | Admitting: Emergency Medicine

## 2013-06-08 ENCOUNTER — Other Ambulatory Visit: Payer: Self-pay | Admitting: Physician Assistant

## 2013-06-26 ENCOUNTER — Other Ambulatory Visit: Payer: Self-pay | Admitting: Emergency Medicine

## 2013-06-29 ENCOUNTER — Other Ambulatory Visit: Payer: Self-pay | Admitting: Emergency Medicine

## 2013-06-29 ENCOUNTER — Other Ambulatory Visit: Payer: Self-pay | Admitting: Physician Assistant

## 2013-07-01 ENCOUNTER — Ambulatory Visit (INDEPENDENT_AMBULATORY_CARE_PROVIDER_SITE_OTHER): Payer: Medicare Other | Admitting: Family Medicine

## 2013-07-01 VITALS — BP 172/98 | HR 65 | Temp 98.1°F | Resp 16 | Ht 68.5 in | Wt 196.6 lb

## 2013-07-01 DIAGNOSIS — E782 Mixed hyperlipidemia: Secondary | ICD-10-CM

## 2013-07-01 DIAGNOSIS — G2581 Restless legs syndrome: Secondary | ICD-10-CM

## 2013-07-01 DIAGNOSIS — Z23 Encounter for immunization: Secondary | ICD-10-CM

## 2013-07-01 DIAGNOSIS — F429 Obsessive-compulsive disorder, unspecified: Secondary | ICD-10-CM

## 2013-07-01 LAB — LIPID PANEL
HDL: 45 mg/dL (ref >39)
LDL Cholesterol: 117 mg/dL — ABNORMAL HIGH (ref 0–99)
Total CHOL/HDL Ratio: 5.1 Ratio
Triglycerides: 333 mg/dL — ABNORMAL HIGH (ref <150)
VLDL: 67 mg/dL — ABNORMAL HIGH (ref 0–40)

## 2013-07-01 LAB — COMPREHENSIVE METABOLIC PANEL
AST: 24 U/L (ref 0–37)
Alkaline Phosphatase: 44 U/L (ref 39–117)
Chloride: 106 mEq/L (ref 96–112)
Glucose, Bld: 88 mg/dL (ref 70–99)
Sodium: 141 mEq/L (ref 135–145)
Total Bilirubin: 0.6 mg/dL (ref 0.3–1.2)
Total Protein: 6.4 g/dL (ref 6.0–8.3)

## 2013-07-01 MED ORDER — FENOFIBRATE 160 MG PO TABS: 160.0000 mg | ORAL_TABLET | Freq: Every day | ORAL | Status: AC

## 2013-07-01 MED ORDER — EZETIMIBE 10 MG PO TABS: 10.0000 mg | ORAL_TABLET | Freq: Every day | ORAL | Status: AC

## 2013-07-01 MED ORDER — COLESEVELAM HCL 625 MG PO TABS
1875.0000 mg | ORAL_TABLET | Freq: Two times a day (BID) | ORAL | Status: DC
Start: 2013-07-01 — End: 2013-11-24

## 2013-07-01 MED ORDER — FLUVOXAMINE MALEATE 100 MG PO TABS: ORAL_TABLET | ORAL | Status: DC

## 2013-07-01 MED ORDER — FLUVOXAMINE MALEATE 100 MG PO TABS: 100.0000 mg | ORAL_TABLET | Freq: Every day | ORAL | Status: DC

## 2013-07-01 MED ORDER — PNEUMOCOCCAL VAC POLYVALENT 25 MCG/0.5ML IJ INJ: 0.5000 mL | INJECTION | INTRAMUSCULAR | Status: DC

## 2013-07-01 MED ORDER — ROPINIROLE HCL 3 MG PO TABS: 3.0000 mg | ORAL_TABLET | Freq: Every day | ORAL | Status: DC

## 2013-07-01 NOTE — Patient Instructions (Addendum)
Continue current medications.  Increase the losartan to 100 mg daily. You can double up on the remaining pills until you start the 100 mg pills  Monitor blood pressure. Goal is to keep it well below 140/90. If you have concerns call back  Return in 3-4 months to see [name] PA  Increase Luvox to 100 mg in morning and 50 mg in the evening

## 2013-07-01 NOTE — Progress Notes (Signed)
Subjective: Patient is here for his regular visit. He has been overall feeling well. Takes his medications faithfully. No new complications in life.  Review of systems is essentially unremarkable. He does say that the OCD has gradually gotten worse over the years, that he counts everything. He wondered whether he can go up on his medication dose.  Objective: Healthy-appearing mildly overweight man in no major acute distress. HEENT normal. Chest clear. Heart regular with grade 1-2/6 murmur over the pulmonic area. Tenderness.  Assessment: Repeated the blood pressure and it was still a little elevate Hypertension OCD History of atrial fibrillation, ablated Hyperlipidemia  Plan:  Check lab Increase Luvox to 100 mg in the morning, 50 in the evening Increase losartan to 100 mg daily

## 2013-07-23 ENCOUNTER — Encounter: Payer: Medicare Other | Admitting: Internal Medicine

## 2013-08-14 ENCOUNTER — Encounter: Payer: Self-pay | Admitting: Internal Medicine

## 2013-08-14 ENCOUNTER — Ambulatory Visit (INDEPENDENT_AMBULATORY_CARE_PROVIDER_SITE_OTHER): Payer: Medicare Other | Admitting: Internal Medicine

## 2013-08-14 VITALS — BP 147/86 | HR 77 | Ht 69.5 in | Wt 196.0 lb

## 2013-08-14 DIAGNOSIS — I4891 Unspecified atrial fibrillation: Secondary | ICD-10-CM

## 2013-08-14 DIAGNOSIS — Z950 Presence of cardiac pacemaker: Secondary | ICD-10-CM

## 2013-08-14 DIAGNOSIS — G4733 Obstructive sleep apnea (adult) (pediatric): Secondary | ICD-10-CM

## 2013-08-14 DIAGNOSIS — I495 Sick sinus syndrome: Secondary | ICD-10-CM

## 2013-08-14 DIAGNOSIS — I10 Essential (primary) hypertension: Secondary | ICD-10-CM

## 2013-08-14 NOTE — Patient Instructions (Addendum)
Your physician recommends that you schedule a follow-up appointment in: 6 months with Dr Caryl Comes

## 2013-08-15 LAB — MDC_IDC_ENUM_SESS_TYPE_INCLINIC
Battery Impedance: 1300 Ohm
Battery Voltage: 2.75 V
Date Time Interrogation Session: 20150205205741
Implantable Pulse Generator Model: 5826
Lead Channel Impedance Value: 408 Ohm
Lead Channel Impedance Value: 429 Ohm
Lead Channel Pacing Threshold Amplitude: 0.75 V
Lead Channel Sensing Intrinsic Amplitude: 1.6 mV
Lead Channel Sensing Intrinsic Amplitude: 10.9 mV
Lead Channel Setting Pacing Amplitude: 2 V
Lead Channel Setting Pacing Amplitude: 2.5 V
Lead Channel Setting Pacing Pulse Width: 0.5 ms
MDC IDC MSMT LEADCHNL RA PACING THRESHOLD PULSEWIDTH: 0.5 ms
MDC IDC MSMT LEADCHNL RV PACING THRESHOLD AMPLITUDE: 1 V
MDC IDC MSMT LEADCHNL RV PACING THRESHOLD PULSEWIDTH: 0.5 ms
MDC IDC PG SERIAL: 1209334
MDC IDC SET LEADCHNL RV SENSING SENSITIVITY: 2 mV

## 2013-08-17 DIAGNOSIS — I10 Essential (primary) hypertension: Secondary | ICD-10-CM | POA: Insufficient documentation

## 2013-08-17 NOTE — Progress Notes (Signed)
PCP:  Jenny Reichmann, MD Primary EP:  Dr Caryl Comes  The patient presents today for routine electrophysiology followup.  Since his last visit, the patient reports doing very well.  He has had no symptomatic afib since his last visit.  Today, he denies symptoms of chest pain, shortness of breath, orthopnea, PND, lower extremity edema, dizziness, presyncope, syncope, or neurologic sequela.  The patient feels that he is tolerating medications without difficulties and is otherwise without complaint today.   Past Medical History  Diagnosis Date  . Atrial fibrillation   . OSA (obstructive sleep apnea)     on CPAP  . Allergic rhinitis   . Hyperlipidemia   . Asthma   . GERD (gastroesophageal reflux disease)   . Barrett's esophagus 03/1993  . RLS (restless legs syndrome)   . Tubular adenoma polyp of rectum 07/1998  . Hiatal hernia   . Hemorrhoids   . AVM (arteriovenous malformation)   . Cataract   . Hypertension   . Sinoatrial node dysfunction     s/p PPM  . Stroke 2008    A-Fib   Past Surgical History  Procedure Laterality Date  . Cholecystectomy open    . Knee arthroscopy    . Tonsillectomy    . Pacemaker insertion  11/15/07    St. Jude Dr. Remer Macho implanted by Dr Caryl Comes  . Tee without cardioversion  11/27/2011    Procedure: TRANSESOPHAGEAL ECHOCARDIOGRAM (TEE);  Surgeon: Fay Records, MD;  Location: [facility] ENDOSCOPY;  Service: Cardiovascular;  Laterality: N/A;  . Atrial fibrillation ablation  11/28/11 and 05/28/12    Afib ablation x 2 by Dr Rayann Heman  . Tee without cardioversion  05/28/2012    Procedure: TRANSESOPHAGEAL ECHOCARDIOGRAM (TEE);  Surgeon: Larey Dresser, MD;  Location: [facility] ENDOSCOPY;  Service: Cardiovascular;  Laterality: N/A;  Dondra Spry Greggory Brandy     Current Outpatient Prescriptions  Medication Sig Dispense Refill  . albuterol (PROVENTIL HFA;VENTOLIN HFA) 108 (90 BASE) MCG/ACT inhaler Inhale 2 puffs into the lungs every 4 (four) hours as needed for wheezing or shortness of breath.  1  Inhaler  prn  . ANDROGEL PUMP 20.25 MG/ACT (1.62%) GEL APPLY 1 PUMP TO EACH AXILLA 1 TIME DAILY  75 g  2  . colesevelam (WELCHOL) 625 MG tablet Take 3 tablets (1,875 mg total) by mouth 2 (two) times daily with a meal.  540 tablet  3  . dabigatran (PRADAXA) 150 MG CAPS Take 1 capsule (150 mg total) by mouth 2 (two) times daily.  180 capsule  3  . ezetimibe (ZETIA) 10 MG tablet Take 1 tablet (10 mg total) by mouth daily.  90 tablet  3  . fenofibrate 160 MG tablet Take 1 tablet (160 mg total) by mouth daily.  90 tablet  3  . fluvoxaMINE (LUVOX) 100 MG tablet Take 150 mg by mouth at bedtime.      Marland Kitchen glucosamine-chondroitin 500-400 MG tablet Take 1 tablet by mouth 2 (two) times daily.        Marland Kitchen losartan (COZAAR) 50 MG tablet Take 100 mg by mouth daily.      . mometasone (NASONEX) 50 MCG/ACT nasal spray 1-2 puffs each nostril once every night at bedtime  17 g  prn  . Multiple Vitamin (MULITIVITAMIN WITH MINERALS) TABS Take 1 tablet by mouth daily.      . Omega-3 Fatty Acids (FISH OIL) 1000 MG CAPS Take 1,000 mg by mouth 2 (two) times daily.       Marland Kitchen omeprazole (PRILOSEC) 40 MG  capsule Take 1 capsule (40 mg total) by mouth daily.  90 capsule  3  . rOPINIRole (REQUIP) 3 MG tablet Take 1 tablet (3 mg total) by mouth at bedtime.  90 tablet  1  . sildenafil (VIAGRA) 100 MG tablet 1/2 tablet as directed  10 tablet  0   Current Facility-Administered Medications  Medication Dose Route Frequency Provider Last Rate Last Dose  . pneumococcal 23 valent vaccine (PNU-IMMUNE) injection 0.5 mL  0.5 mL Intramuscular Tomorrow-1000 Posey Boyer, MD        Allergies  Allergen Reactions  . Niacin Itching  . Statins     Liver damage    History   Social History  . Marital Status: Married    Spouse Name: Manuela Schwartz    Number of Children: 1  . 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    Marland Kitchen     Social History Main Topics  . Smoking status: Never Smoker   . Smokeless  tobacco: Never Used  . Alcohol Use: Yes     Comment: social  . Drug Use: No  . Sexual Activity: Yes    Partners: Female   Other Topics Concern  . Not on file   Social History Narrative   Lives with his wife Collie Siad, 2nd wife; first wife died of lung cancer in 25-Oct-1997), in [place].  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  Enjoys scuba diving. [place].  He and his wife hope to sell their homes here and at the the [place] and move to a retirement community in central [place].    Family History  Problem Relation Age of Onset  . Sleep apnea Brother   . Stroke Mother   . Heart attack Father   . Heart disease Father   . Heart attack Sister   . Ulcers Son 5  . Heart disease Paternal Grandmother     ROS-  All systems are reviewed and are negative except as outlined in the HPI above   Physical Exam: Filed Vitals:   08/14/13 1549  BP: 147/86  Pulse: 77  Height: 5' 9.5" (1.765 m)  Weight: 196 lb (88.905 kg)    GEN- The patient is well appearing, alert and oriented x 3 today.   Head- normocephalic, atraumatic Eyes-  Sclera clear, conjunctiva pink Ears- hearing intact Oropharynx- clear Neck- supple, no JVP Lymph- no cervical lymphadenopathy Lungs- Clear to ausculation bilaterally, normal work of breathing Chest- pacemaker pocket is well healed Heart- Regular rate and rhythm, no murmurs, rubs or gallops, PMI not laterally displaced GI- soft, NT, ND, + BS Extremities- no clubbing, cyanosis, or edema Neuro- strength and sensation are intact  Pacemaker interrogation- reviewed in detail today,  See PACEART report  Assessment and Plan:  1, Bradycardia Normal pacemaker function See Pace Art report No changes today  2. Afib Maintaining sinus rhythm post ablation chads2vasc score is at least 4.  He should therefore continue anticoagulation long term Continue pradaxa  3.  HTN Stable No change required today  4.  OSA Compliance with CPAP encouraged  Follow-up with Dr Caryl Comes in 6 months

## 2013-08-25 ENCOUNTER — Encounter: Payer: Self-pay | Admitting: Internal Medicine

## 2013-08-25 ENCOUNTER — Telehealth: Payer: Self-pay

## 2013-08-25 MED ORDER — FLUVOXAMINE MALEATE 100 MG PO TABS: 150.0000 mg | ORAL_TABLET | Freq: Every day | ORAL | Status: DC

## 2013-08-25 MED ORDER — LOSARTAN POTASSIUM 100 MG PO TABS
100.0000 mg | ORAL_TABLET | Freq: Every day | ORAL | Status: DC
Start: 2013-08-25 — End: 2013-11-24

## 2013-08-25 NOTE — Telephone Encounter (Signed)
Refilled 6 months for pt. He will get this refilled by his cardiologist in 6 months.

## 2013-08-25 NOTE — Telephone Encounter (Signed)
Resent Luvox to pharmacy- pt states that pharmacy did not receive the updated prescription in December when Pt saw Dr. Linna Darner. Pt used up the original script.

## 2013-08-25 NOTE — Telephone Encounter (Signed)
PT STATES HE WAS DO DOUBLE UP ON HIS COZAAR AND NOW IS OUT, HAD CALLED ABOUT THIS BEFORE BUT NO HAVE CALLED HIM BACK PLEASE CALL [phone]    [facility] [address]

## 2013-10-09 ENCOUNTER — Telehealth: Payer: Self-pay | Admitting: Internal Medicine

## 2013-10-09 MED ORDER — MOMETASONE FUROATE 50 MCG/ACT NA SUSP: NASAL | Status: AC

## 2013-10-09 NOTE — Telephone Encounter (Signed)
Called and spoke with pt and he is aware of refill that has been sent to the pharmacy.  Nothing further is needed.  

## 2013-10-10 ENCOUNTER — Ambulatory Visit (INDEPENDENT_AMBULATORY_CARE_PROVIDER_SITE_OTHER): Payer: Medicare Other | Admitting: Emergency Medicine

## 2013-10-10 VITALS — BP 144/92 | HR 68 | Temp 97.6°F | Resp 18 | Ht 70.0 in | Wt 199.0 lb

## 2013-10-10 DIAGNOSIS — K4090 Unilateral inguinal hernia, without obstruction or gangrene, not specified as recurrent: Secondary | ICD-10-CM

## 2013-10-10 NOTE — Patient Instructions (Signed)
Hernia A hernia occurs when an internal organ pushes out through a weak spot in the abdominal wall. Hernias most commonly occur in the groin and around the navel. Hernias often can be pushed back into place (reduced). Most hernias tend to get worse over time. Some abdominal hernias can get stuck in the opening (irreducible or incarcerated hernia) and cannot be reduced. An irreducible abdominal hernia which is tightly squeezed into the opening is at risk for impaired blood supply (strangulated hernia). A strangulated hernia is a medical emergency. Because of the risk for an irreducible or strangulated hernia, surgery may be recommended to repair a hernia. CAUSES   Heavy lifting.  Prolonged coughing.  Straining to have a bowel movement.  A cut (incision) made during an abdominal surgery. HOME CARE INSTRUCTIONS   Bed rest is not required. You may continue your normal activities.  Avoid lifting more than 10 pounds (4.5 kg) or straining.  Cough gently. If you are a smoker it is best to stop. Even the best hernia repair can break down with the continual strain of coughing. Even if you do not have your hernia repaired, a cough will continue to aggravate the problem.  Do not wear anything tight over your hernia. Do not try to keep it in with an outside bandage or truss. These can damage abdominal contents if they are trapped within the hernia sac.  Eat a normal diet.  Avoid constipation. Straining over long periods of time will increase hernia size and encourage breakdown of repairs. If you cannot do this with diet alone, stool softeners may be used. SEEK IMMEDIATE MEDICAL CARE IF:   You have a fever.  You develop increasing abdominal pain.  You feel nauseous or vomit.  Your hernia is stuck outside the abdomen, looks discolored, feels hard, or is tender.  You have any changes in your bowel habits or in the hernia that are unusual for you.  You have increased pain or swelling around the  hernia.  You cannot push the hernia back in place by applying gentle pressure while lying down. MAKE SURE YOU:   Understand these instructions.  Will watch your condition.  Will get help right away if you are not doing well or get worse. Document Released: 06/26/2005 Document Revised: 09/18/2011 Document Reviewed: 02/13/2008 Exit[employer] Patient Information 2014 [employer].  

## 2013-10-10 NOTE — Progress Notes (Signed)
[facility] [address] [place] [address] [phone]  Date:  10/10/2013   Name:  [name]   DOB:  17-Jan-1946   MRN:  [id number]  PCP:  Jenny Reichmann, MD    Chief Complaint: Groin Pain   History of Present Illness:  [name] is a 68 y.o. very pleasant male patient who presents with the following:  Pain in right testicle.  No history of trauma or discharge.  No swelling or tenderness.  Denies other complaint or health concern today.   Patient Active Problem List   Diagnosis Date Noted  . Essential hypertension 08/17/2013  . Cough 04/16/2012  . Sinoatrial node dysfunction   . Esophagus, Barrett's 07/26/2011  . Hernia, hiatal 07/26/2011  . Hypogonadism male 07/26/2011  . 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  . OTHER ANTERIOR PITUITARY DISORDERS 06/30/2010  . SYNCOPE 03/29/2010  . DYSPNEA ON EXERTION 01/03/2010  . Asthma, mild intermittent 07/23/2009  . C V A / STROKE 07/25/2007  . Seasonal and perennial allergic rhinitis 07/25/2007  . HYPERLIPIDEMIA 07/24/2007  . OBSTRUCTIVE SLEEP APNEA 07/24/2007  . RESTLESS LEGS SYNDROME 07/24/2007  . Atrial fibrillation 07/24/2007  . GERD 07/24/2007    Past Medical History  Diagnosis Date  . Atrial fibrillation   . OSA (obstructive sleep apnea)     on CPAP  . Allergic rhinitis   . Hyperlipidemia   . Asthma   . GERD (gastroesophageal reflux disease)   . Barrett's esophagus 03/1993  . RLS (restless legs syndrome)   . Tubular adenoma polyp of rectum 07/1998  . Hiatal hernia   . Hemorrhoids   . AVM (arteriovenous malformation)   . Cataract   . Hypertension   . Sinoatrial node dysfunction     s/p PPM  . Stroke 2008    A-Fib    Past Surgical History  Procedure Laterality Date  . Cholecystectomy open    . Knee arthroscopy    . Tonsillectomy    . Pacemaker insertion  11/15/07    St. Jude Dr. Remer Macho implanted by Dr Caryl Comes  . Tee without cardioversion  11/27/2011    Procedure: TRANSESOPHAGEAL  ECHOCARDIOGRAM (TEE);  Surgeon: Fay Records, MD;  Location: [facility] ENDOSCOPY;  Service: Cardiovascular;  Laterality: N/A;  . Atrial fibrillation ablation  11/28/11 and 05/28/12    Afib ablation x 2 by Dr Rayann Heman  . Tee without cardioversion  05/28/2012    Procedure: TRANSESOPHAGEAL ECHOCARDIOGRAM (TEE);  Surgeon: Larey Dresser, MD;  Location: [place];  Service: Cardiovascular;  Laterality: N/A;  [place]     History  Substance Use Topics  . Smoking status: Never Smoker   . Smokeless tobacco: Never Used  . Alcohol Use: Yes     Comment: social    Family History  Problem Relation Age of Onset  . Sleep apnea Brother   . Stroke Mother   . Heart attack Father   . Heart disease Father   . Heart attack Sister   . Ulcers Son 5  . Heart disease Paternal Grandmother     Allergies  Allergen Reactions  . Niacin Itching  . Statins     Liver damage    Medication list has been reviewed and updated.  Current Outpatient Prescriptions on File Prior to Visit  Medication Sig Dispense Refill  . ANDROGEL PUMP 20.25 MG/ACT (1.62%) GEL APPLY 1 PUMP TO EACH AXILLA 1 TIME DAILY  75 g  2  . colesevelam (WELCHOL) 625 MG tablet  Take 3 tablets (1,875 mg total) by mouth 2 (two) times daily with a meal.  540 tablet  3  . dabigatran (PRADAXA) 150 MG CAPS Take 1 capsule (150 mg total) by mouth 2 (two) times daily.  180 capsule  3  . ezetimibe (ZETIA) 10 MG tablet Take 1 tablet (10 mg total) by mouth daily.  90 tablet  3  . fenofibrate 160 MG tablet Take 1 tablet (160 mg total) by mouth daily.  90 tablet  3  . fluvoxaMINE (LUVOX) 100 MG tablet Take 1.5 tablets (150 mg total) by mouth at bedtime.  135 tablet  0  . glucosamine-chondroitin 500-400 MG tablet Take 1 tablet by mouth 2 (two) times daily.        Marland Kitchen losartan (COZAAR) 100 MG tablet Take 1 tablet (100 mg total) by mouth daily.  90 tablet  1  . mometasone (NASONEX) 50 MCG/ACT nasal spray 1-2 puffs each nostril once every night at bedtime  17 g  6   . Multiple Vitamin (MULITIVITAMIN WITH MINERALS) TABS Take 1 tablet by mouth daily.      . Omega-3 Fatty Acids (FISH OIL) 1000 MG CAPS Take 1,000 mg by mouth 2 (two) times daily.       Marland Kitchen omeprazole (PRILOSEC) 40 MG capsule Take 1 capsule (40 mg total) by mouth daily.  90 capsule  3  . rOPINIRole (REQUIP) 3 MG tablet Take 1 tablet (3 mg total) by mouth at bedtime.  90 tablet  1  . sildenafil (VIAGRA) 100 MG tablet 1/2 tablet as directed  10 tablet  0  . albuterol (PROVENTIL HFA;VENTOLIN HFA) 108 (90 BASE) MCG/ACT inhaler Inhale 2 puffs into the lungs every 4 (four) hours as needed for wheezing or shortness of breath.  1 Inhaler  prn   Current Facility-Administered Medications on File Prior to Visit  Medication Dose Route Frequency Provider Last Rate Last Dose  . pneumococcal 23 valent vaccine (PNU-IMMUNE) injection 0.5 mL  0.5 mL Intramuscular Tomorrow-1000 Posey Boyer, MD        Review of Systems:  As per HPI, otherwise negative.    Physical Examination: Filed Vitals:   10/10/13 1210  BP: 144/92  Pulse: 68  Temp: 97.6 F (36.4 C)  Resp: 18   Filed Vitals:   10/10/13 1210  Height: 5\' 10"  (1.778 m)  Weight: 199 lb (90.266 kg)   Body mass index is 28.55 kg/(m^2). Ideal Body Weight: Weight in (lb) to have BMI = 25: 173.9   GEN: WDWN, NAD, Non-toxic, Alert & Oriented x 3 HEENT: Atraumatic, Normocephalic.  Ears and Nose: No external deformity. EXTR: No clubbing/cyanosis/edema NEURO: Normal gait.  PSYCH: Normally interactive. Conversant. Not depressed or anxious appearing.  Calm demeanor.  Genitalia: questionable right inguinal and thickened cord.  Testes and epididymis normal and not tender  Assessment and Plan: Right inguinal hernia Surgical consultation.;l   Signed,  Ellison Carwin, MD

## 2013-10-16 ENCOUNTER — Encounter: Payer: Self-pay | Admitting: Gastroenterology

## 2013-10-24 ENCOUNTER — Ambulatory Visit (INDEPENDENT_AMBULATORY_CARE_PROVIDER_SITE_OTHER): Payer: BC Managed Care – PPO | Admitting: General Surgery

## 2013-10-30 ENCOUNTER — Encounter (INDEPENDENT_AMBULATORY_CARE_PROVIDER_SITE_OTHER): Payer: Self-pay | Admitting: General Surgery

## 2013-10-30 ENCOUNTER — Ambulatory Visit (INDEPENDENT_AMBULATORY_CARE_PROVIDER_SITE_OTHER): Payer: Medicare Other | Admitting: General Surgery

## 2013-10-30 VITALS — BP 154/86 | HR 80 | Temp 97.6°F | Resp 14 | Ht 70.0 in | Wt 191.0 lb

## 2013-10-30 DIAGNOSIS — R109 Unspecified abdominal pain: Secondary | ICD-10-CM

## 2013-10-30 DIAGNOSIS — R1030 Lower abdominal pain, unspecified: Secondary | ICD-10-CM

## 2013-10-30 NOTE — Progress Notes (Signed)
Patient ID: [name], male   DOB: 1946-06-16, 68 y.o.   MRN: [id number]  Chief Complaint  Patient presents with  . New Evaluation    eval possible RIH    HPI [name] is a 68 y.o. male.  The patient is a 68 year old male is referred by Dr. Ouida Sills for evaluation of a possible right inguinal hernia. Patient states he noticed some discomfort to his right inguinal region however has noticed no bulge. He states that there is no specific activity that makes it better or worse. 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 He states he does have some discomfort at nighttime.The patient is an avid scuba diver and is mainly concerned for having something troublesome occur while scuba diving.  HPI  Past Medical History  Diagnosis Date  . Atrial fibrillation   . OSA (obstructive sleep apnea)     on CPAP  . Allergic rhinitis   . Hyperlipidemia   . Asthma   . GERD (gastroesophageal reflux disease)   . Barrett's esophagus 03/1993  . RLS (restless legs syndrome)   . Tubular adenoma polyp of rectum 07/1998  . Hiatal hernia   . Hemorrhoids   . AVM (arteriovenous malformation)   . Cataract   . Hypertension   . Sinoatrial node dysfunction     s/p PPM  . Stroke 2008    A-Fib    Past Surgical History  Procedure Laterality Date  . Cholecystectomy open    . Knee arthroscopy    . Tonsillectomy    . Pacemaker insertion  11/15/07    St. Jude Dr. Remer Macho implanted by Dr Caryl Comes  . Tee without cardioversion  11/27/2011    Procedure: TRANSESOPHAGEAL ECHOCARDIOGRAM (TEE);  Surgeon: Fay Records, MD;  Location: [facility] ENDOSCOPY;  Service: Cardiovascular;  Laterality: N/A;  . Atrial fibrillation ablation  11/28/11 and 05/28/12    Afib ablation x 2 by Dr Rayann Heman  . Tee without cardioversion  05/28/2012    Procedure: TRANSESOPHAGEAL ECHOCARDIOGRAM (TEE);  Surgeon: Larey Dresser, MD;  Location: [facility] ENDOSCOPY;  Service:  Cardiovascular;  Laterality: N/A;  Dondra Spry Greggory Brandy     Family History  Problem Relation Age of Onset  . Sleep apnea Brother   . Stroke Mother   . Heart attack Father   . Heart disease Father   . Heart attack Sister   . Ulcers Son 5  . Heart disease Paternal Grandmother     Social History History  Substance Use Topics  . Smoking status: Former Smoker    Quit date: 10/30/1977  . Smokeless tobacco: Never Used  . Alcohol Use: Yes     Comment: social    Allergies  Allergen Reactions  . Niacin Itching  . Statins     Liver damage    Current Outpatient Prescriptions  Medication Sig Dispense Refill  . ANDROGEL PUMP 20.25 MG/ACT (1.62%) GEL APPLY 1 PUMP TO EACH AXILLA 1 TIME DAILY  75 g  2  . colesevelam (WELCHOL) 625 MG tablet Take 3 tablets (1,875 mg total) by mouth 2 (two) times daily with a meal.  540 tablet  3  . dabigatran (PRADAXA) 150 MG CAPS Take 1 capsule (150 mg total) by mouth 2 (two) times daily.  180 capsule  3  . ezetimibe (ZETIA) 10 MG tablet Take 1 tablet (10 mg total) by mouth daily.  90 tablet  3  . fenofibrate 160  MG tablet Take 1 tablet (160 mg total) by mouth daily.  90 tablet  3  . fluvoxaMINE (LUVOX) 100 MG tablet Take 1.5 tablets (150 mg total) by mouth at bedtime.  135 tablet  0  . glucosamine-chondroitin 500-400 MG tablet Take 1 tablet by mouth 2 (two) times daily.        Marland Kitchen losartan (COZAAR) 100 MG tablet Take 1 tablet (100 mg total) by mouth daily.  90 tablet  1  . mometasone (NASONEX) 50 MCG/ACT nasal spray 1-2 puffs each nostril once every night at bedtime  17 g  6  . Multiple Vitamin (MULITIVITAMIN WITH MINERALS) TABS Take 1 tablet by mouth daily.      . Omega-3 Fatty Acids (FISH OIL) 1000 MG CAPS Take 1,000 mg by mouth 2 (two) times daily.       Marland Kitchen omeprazole (PRILOSEC) 40 MG capsule Take 1 capsule (40 mg total) by mouth daily.  90 capsule  3  . rOPINIRole (REQUIP) 3 MG tablet Take 1 tablet (3 mg total) by mouth at bedtime.  90 tablet  1  . sildenafil  (VIAGRA) 100 MG tablet 1/2 tablet as directed  10 tablet  0  . albuterol (PROVENTIL HFA;VENTOLIN HFA) 108 (90 BASE) MCG/ACT inhaler Inhale 2 puffs into the lungs every 4 (four) hours as needed for wheezing or shortness of breath.  1 Inhaler  prn   Current Facility-Administered Medications  Medication Dose Route Frequency Provider Last Rate Last Dose  . pneumococcal 23 valent vaccine (PNU-IMMUNE) injection 0.5 mL  0.5 mL Intramuscular Tomorrow-1000 Posey Boyer, MD        Review of Systems Review of Systems  Constitutional: Negative.   HENT: Negative.   Eyes: Negative.   Respiratory: Negative.   Cardiovascular: Negative.   Gastrointestinal: Negative.   Endocrine: Negative.   Neurological: Negative.     Blood pressure 154/86, pulse 80, temperature 97.6 F (36.4 C), temperature source Temporal, resp. rate 14, height 5\' 10"  (1.778 m), weight 191 lb (86.637 kg).  Physical Exam Physical Exam  Constitutional: He is oriented to person, place, and time. He appears well-developed and well-nourished.  HENT:  Head: Normocephalic and atraumatic.  Eyes: Conjunctivae and EOM are normal. Pupils are equal, round, and reactive to light.  Neck: Normal range of motion. Neck supple.  Cardiovascular: Normal rate, regular rhythm and normal heart sounds.   Pulmonary/Chest: Effort normal and breath sounds normal.  Abdominal: Hernia confirmed negative in the ventral area, confirmed negative in the right inguinal area and confirmed negative in the left inguinal area.  Musculoskeletal: Normal range of motion.  Neurological: He is alert and oriented to person, place, and time.  Skin: Skin is warm and dry.    Data Reviewed none  Assessment    68 year old male with a possible very early right inguinal hernia versus possible groin strain.     Plan    1 I do not feel an overt right inguinal hernia on palpation. I did discuss with the patient this could be a very early right inguinal hernia and/or a  fat-containing hernia that is causing discomfort. I do not believe that this would cause him any trouble with scuba diving. I did discuss that if there is a hernia it potentially could get bigger and cause more pain. Also discussed the possibility of obtaining a CT scan for definitive diagnosis. At this time he would like to wait and see if this gets better, versus a hernia getting bigger. 2. The patient follow  up as needed       Ralene Ok 10/30/2013, 10:15 AM

## 2013-11-03 ENCOUNTER — Encounter: Payer: Self-pay | Admitting: Gastroenterology

## 2013-11-03 ENCOUNTER — Ambulatory Visit (INDEPENDENT_AMBULATORY_CARE_PROVIDER_SITE_OTHER): Payer: Medicare Other | Admitting: Gastroenterology

## 2013-11-03 ENCOUNTER — Telehealth: Payer: Self-pay | Admitting: *Deleted

## 2013-11-03 VITALS — BP 150/94 | HR 72 | Ht 70.0 in | Wt 198.2 lb

## 2013-11-03 DIAGNOSIS — K219 Gastro-esophageal reflux disease without esophagitis: Secondary | ICD-10-CM

## 2013-11-03 DIAGNOSIS — K2270 Barrett's esophagus without dysplasia: Secondary | ICD-10-CM

## 2013-11-03 NOTE — Progress Notes (Signed)
11/03/2013 [name] [id number] 07/24/1945   History of Present Illness:  Patient is a 68 year old male known to Dr. Fuller Plan for previous colonoscopies and EGD's for surveillance of his Barrett's esophagus.  Has PMH of atrial fibrillation with ablation x 2 in 2013 with great reduction in his atrial fibrillation occurences, HTN, pacemaker placement, OSA, HLD, and CVA in 2008.  He is here today to schedule EGD for Barrett's surveillance.  Last EGD in 12/2010 revealed Barrett's esophagus, confirmed on biopsies, but no dysplasia seen.  He was also found to have moderate gastritis and hiatal hernia; no Hpylori on gastric biopsies.  His GERD symptoms are well controlled on omeprazole 40 mg daily.  No current complaints.  He is up to date with his colonoscopies as well with last in 12/2010 that showed only mild diverticulosis and internal hemorrhoids with repeat recommended again in 5 years for history of adenomatous polyps.   Current Medications, Allergies, Past Medical History, Past Surgical History, Family History and Social History were reviewed in [employer] record.   Physical Exam: BP 150/94  Pulse 72  Ht 5\' 10"  (1.778 m)  Wt 198 lb 3.2 oz (89.903 kg)  BMI 28.44 kg/m2 General: Well developed white male in no acute distress Head: Normocephalic and atraumatic Eyes:  Sclerae anicteric, conjunctiva pink  Ears: Normal auditory acuity Lungs: Clear throughout to auscultation Heart: Regular rate and rhythm Abdomen: Soft, non-distended.  Normal bowel sounds.  Non-tender. Musculoskeletal: Symmetrical with no gross deformities  Extremities: No edema  Neurological: Alert oriented x 4, grossly non-focal Psychological:  Alert and cooperative. Normal mood and affect  Assessment and Recommendations: -Barrett's esophagus/GERD:  Due for surveillance EGD in June.  Symptoms well controlled on Omeprazole 40 mg daily and he will continue that.  Will schedule procedure for July at  patient's request.  The risks, benefits, and alternatives were discussed with the patient and he consents to proceed.  The risks benefits and alternatives to a temporary hold of anti-coagulants/anti-platelets for the procedure were discussed with the patient they consent to proceed. Obtain clearance from Dr. Rayann Heman for ok to hold Pradaxa.

## 2013-11-03 NOTE — Telephone Encounter (Signed)
OK to hold pradaxa 48 hours prior to the procedure and then restart following the procedure.  If he is having significant GERD symptoms, then you could switch to eliquis 5mg  BID which does not cause dyspepsia. -----   Message ----- From: Carlyle Dolly, CMA Sent: 11/03/2013 9:03 AM To: Thompson Grayer, MD   [facility] for patient to call back.Marland Kitchen

## 2013-11-03 NOTE — Telephone Encounter (Signed)
11/03/2013   RE: [name] DOB: Dec 29, 1945 MRN: [id number]   Dear Dr. Rayann Heman,    We have scheduled the above patient for an endoscopic procedure. Our records show that he is on anticoagulation therapy.   Please advise as to how long the patient may come off his therapy of Pradaxa prior to the procedure, which is scheduled for July.  Please fax back/ or route the completed form to [employer], CMA.   Sincerely,    Carlyle Dolly

## 2013-11-03 NOTE — Patient Instructions (Signed)
It has been recommended to you by your physician that you have a(n) Upper Endoscopy completed.  Please contact our office at [phone] in the middle of June to schedule a free visit with a nurse to go over your paperwork and to schedule your procedure for July as you requested.  We will get clearance for your Pradaxa for a July procedure. If you have to schedule the Upper Endoscopy further out then July you will have to come back in for a office visit to get clearance for Pradaxa.

## 2013-11-03 NOTE — Progress Notes (Signed)
Reviewed and agree with management plan.  Terrian Sentell T. Edwar Coe, MD FACG 

## 2013-11-09 ENCOUNTER — Other Ambulatory Visit: Payer: Self-pay | Admitting: Physician Assistant

## 2013-11-10 NOTE — Telephone Encounter (Signed)
Looks like pt is due for labs, put note on RF needs OV/labs.

## 2013-11-11 NOTE — Telephone Encounter (Signed)
Faxed

## 2013-11-24 ENCOUNTER — Ambulatory Visit: Payer: BC Managed Care – PPO

## 2013-11-24 ENCOUNTER — Ambulatory Visit (INDEPENDENT_AMBULATORY_CARE_PROVIDER_SITE_OTHER): Payer: Medicare Other | Admitting: Physician Assistant

## 2013-11-24 VITALS — BP 154/96 | HR 72 | Temp 98.6°F | Resp 16 | Ht 68.0 in | Wt 191.6 lb

## 2013-11-24 DIAGNOSIS — K2270 Barrett's esophagus without dysplasia: Secondary | ICD-10-CM

## 2013-11-24 DIAGNOSIS — K219 Gastro-esophageal reflux disease without esophagitis: Secondary | ICD-10-CM

## 2013-11-24 DIAGNOSIS — G2581 Restless legs syndrome: Secondary | ICD-10-CM

## 2013-11-24 DIAGNOSIS — N529 Male erectile dysfunction, unspecified: Secondary | ICD-10-CM | POA: Insufficient documentation

## 2013-11-24 DIAGNOSIS — F429 Obsessive-compulsive disorder, unspecified: Secondary | ICD-10-CM

## 2013-11-24 DIAGNOSIS — E291 Testicular hypofunction: Secondary | ICD-10-CM

## 2013-11-24 DIAGNOSIS — Z1159 Encounter for screening for other viral diseases: Secondary | ICD-10-CM

## 2013-11-24 DIAGNOSIS — M25559 Pain in unspecified hip: Secondary | ICD-10-CM

## 2013-11-24 DIAGNOSIS — E785 Hyperlipidemia, unspecified: Secondary | ICD-10-CM

## 2013-11-24 DIAGNOSIS — I10 Essential (primary) hypertension: Secondary | ICD-10-CM

## 2013-11-24 LAB — POCT CBC
Granulocyte percent: 74.7 %G (ref 37–80)
HCT, POC: 47.5 % (ref 43.5–53.7)
HEMOGLOBIN: 15.7 g/dL (ref 14.1–18.1)
LYMPH, POC: 1.1 (ref 0.6–3.4)
MCH, POC: 32.1 pg — AB (ref 27–31.2)
MCHC: 33.1 g/dL (ref 31.8–35.4)
MCV: 97.2 fL — AB (ref 80–97)
MID (cbc): 0.4 (ref 0–0.9)
MPV: 10.4 fL (ref 0–99.8)
PLATELET COUNT, POC: 158 10*3/uL (ref 142–424)
POC GRANULOCYTE: 4.3 (ref 2–6.9)
POC LYMPH %: 19.2 % (ref 10–50)
POC MID %: 6.1 %M (ref 0–12)
RBC: 4.89 M/uL (ref 4.69–6.13)
RDW, POC: 14.1 %
WBC: 5.8 10*3/uL (ref 4.6–10.2)

## 2013-11-24 LAB — COMPREHENSIVE METABOLIC PANEL
ALT: 24 U/L (ref 0–53)
AST: 26 U/L (ref 0–37)
Albumin: 5.1 g/dL (ref 3.5–5.2)
Alkaline Phosphatase: 48 U/L (ref 39–117)
BILIRUBIN TOTAL: 0.6 mg/dL (ref 0.2–1.2)
BUN: 24 mg/dL — ABNORMAL HIGH (ref 6–23)
CO2: 25 mEq/L (ref 19–32)
Calcium: 10.7 mg/dL — ABNORMAL HIGH (ref 8.4–10.5)
Chloride: 103 mEq/L (ref 96–112)
Creat: 1.33 mg/dL (ref 0.50–1.35)
Glucose, Bld: 92 mg/dL (ref 70–99)
Potassium: 4.6 mEq/L (ref 3.5–5.3)
Sodium: 140 mEq/L (ref 135–145)
TOTAL PROTEIN: 7.5 g/dL (ref 6.0–8.3)

## 2013-11-24 LAB — HEPATITIS C ANTIBODY: HCV AB: NEGATIVE

## 2013-11-24 LAB — LIPID PANEL
Cholesterol: 242 mg/dL — ABNORMAL HIGH (ref 0–200)
HDL: 53 mg/dL (ref >39)
LDL Cholesterol: 125 mg/dL — ABNORMAL HIGH (ref 0–99)
Total CHOL/HDL Ratio: 4.6 Ratio
Triglycerides: 319 mg/dL — ABNORMAL HIGH (ref <150)
VLDL: 64 mg/dL — ABNORMAL HIGH (ref 0–40)

## 2013-11-24 LAB — TSH: TSH: 2.594 u[IU]/mL (ref 0.350–4.500)

## 2013-11-24 LAB — TESTOSTERONE: Testosterone: 184 ng/dL — ABNORMAL LOW (ref 300–890)

## 2013-11-24 MED ORDER — TESTOSTERONE 20.25 MG/ACT (1.62%) TD GEL: TRANSDERMAL | Status: DC

## 2013-11-24 MED ORDER — OMEPRAZOLE 40 MG PO CPDR: 40.0000 mg | DELAYED_RELEASE_CAPSULE | Freq: Every day | ORAL | Status: AC

## 2013-11-24 MED ORDER — LOSARTAN POTASSIUM 100 MG PO TABS: 100.0000 mg | ORAL_TABLET | Freq: Every day | ORAL | Status: DC

## 2013-11-24 MED ORDER — SILDENAFIL CITRATE 100 MG PO TABS: ORAL_TABLET | ORAL | Status: AC

## 2013-11-24 MED ORDER — ROPINIROLE HCL 3 MG PO TABS: 3.0000 mg | ORAL_TABLET | Freq: Every day | ORAL | Status: DC

## 2013-11-24 MED ORDER — COLESEVELAM HCL 625 MG PO TABS: 1875.0000 mg | ORAL_TABLET | Freq: Two times a day (BID) | ORAL | Status: AC

## 2013-11-24 MED ORDER — FLUVOXAMINE MALEATE 100 MG PO TABS: ORAL_TABLET | ORAL | Status: AC

## 2013-11-24 NOTE — Patient Instructions (Addendum)
Send me a message in My Chart with the date of your shingles vaccine. Next time you are here, bring a copy of your vaccine records and I'll get them entered into the system. Check you blood pressure 1-2 times each week and bring me a record at your next visit.

## 2013-11-24 NOTE — Progress Notes (Signed)
Subjective:    Patient ID: [name], male    DOB: 07-29-1945, 68 y.o.   MRN: [id number]   PCP: Jenny Reichmann, MD  Chief Complaint  Patient presents with  . Medication Refill    Welchol,Luvox, Cozaar, Prilosec, Requip, Viagra, and Testosterone   Medications, allergies, past medical history, surgical history, family history, social history and problem list reviewed and updated.  HPI  Overall, he's feeling good.  He got an unexpected offer on his beach house, and the closing is set for a few weeks from now.  He hopes to sell his [place] before moving to [place], so that's his next project after emptying his beach house.  Needs refills on medications and is fasting today to update his labs.  Review of Systems No chest pain, SOB, HA, dizziness, vision change, N/V, diarrhea, constipation, dysuria, urinary urgency or frequency, or rash.  He reports pain in the LEFT hip with standing/walking after 20+ minutes of sitting. The pain is severe enough that he has to hold on to something, but resolves quickly with movement.  He does NOT have pain first thing in the morning upon waking. No radicular pain.  No paresthesias in the leg. He has known lumbar DDD.     Objective:   Physical Exam  Vitals reviewed. Constitutional: He is oriented to person, place, and time. Vital signs are normal. He appears well-developed and well-nourished. He is active and cooperative. No distress.  BP 154/96  Pulse 72  Temp(Src) 98.6 F (37 C) (Oral)  Resp 16  Ht 5\' 8"  (1.727 m)  Wt 191 lb 9.6 oz (86.909 kg)  BMI 29.14 kg/m2  SpO2 95%  HENT:  Head: Normocephalic and atraumatic.  Right Ear: Hearing normal.  Left Ear: Hearing normal.  Eyes: Conjunctivae are normal. No scleral icterus.  Neck: Normal range of motion. Neck supple. No thyromegaly present.  Cardiovascular: Normal rate, regular rhythm and normal heart sounds.   Pulses:      Radial pulses are 2+ on the right side, and 2+ on the left  side.  Pulmonary/Chest: Effort normal and breath sounds normal.  Musculoskeletal:       Left hip: He exhibits normal range of motion, normal strength, no swelling and no crepitus.       Lumbar back: Normal.       Legs: Lymphadenopathy:       Head (right side): No tonsillar, no preauricular, no posterior auricular and no occipital adenopathy present.       Head (left side): No tonsillar, no preauricular, no posterior auricular and no occipital adenopathy present.    He has no cervical adenopathy.       Right: No supraclavicular adenopathy present.       Left: No supraclavicular adenopathy present.  Neurological: He is alert and oriented to person, place, and time. He has normal strength. No sensory deficit.  Reflex Scores:      Patellar reflexes are 3+ on the right side and 3+ on the left side.      Achilles reflexes are 2+ on the right side and 2+ on the left side. Skin: Skin is warm, dry and intact. No rash noted. No cyanosis or erythema. Nails show no clubbing.  Psychiatric: He has a normal mood and affect.      LEFT Hip: [facility] reading (PRIMARY) by  Dr. Elder Cyphers. Normal hip/pelvis. Degenerative changes noted in the lumbar spine.      Assessment & Plan:  1. Essential hypertension Above  goal today.  He notes elevated BP since his losartan was increased in 06/2013.  I note that prior to that visit, his BP had been well controlled, and at that visit, the Luvox dose was also increased.  He'll monitor his BP at home and let me know if it is consistently elevated. - Comprehensive metabolic panel - TSH - losartan (COZAAR) 100 MG tablet; Take 1 tablet (100 mg total) by mouth daily.  Dispense: 90 tablet; Refill: 3  2. HYPERLIPIDEMIA Await fasting lab results. Continue current treatment. - Lipid panel - colesevelam (WELCHOL) 625 MG tablet; Take 3 tablets (1,875 mg total) by mouth 2 (two) times daily with a meal.  Dispense: 540 tablet; Refill: 3  3. RESTLESS LEGS SYNDROME Controlled.  Continue current treatment. - POCT CBC - rOPINIRole (REQUIP) 3 MG tablet; Take 1 tablet (3 mg total) by mouth at bedtime.  Dispense: 90 tablet; Refill: 1  4. OCD (obsessive compulsive disorder) Counting is better, but not resolved on current Luvox dose. - fluvoxaMINE (LUVOX) 100 MG tablet; Take 100 mg QAM, take 50 mg QHS  Dispense: 135 tablet; Refill: 1  5. GERD 6. Esophagus, Barrett's Controlled. Continue current treatment - omeprazole (PRILOSEC) 40 MG capsule; Take 1 capsule (40 mg total) by mouth daily.  Dispense: 90 capsule; Refill: 3  7. Erectile dysfunction Continue current treatment - sildenafil (VIAGRA) 100 MG tablet; 1/2 tablet as directed  Dispense: 10 tablet; Refill: 3  8. Hypogonadism male Update labs.  Continue current treatment - PSA - Testosterone - Testosterone (ANDROGEL PUMP) 20.25 MG/ACT (1.62%) GEL; Apply 1 pump to each axilla once daily.  Dispense: 75 g; Refill: 0  9. Need for hepatitis C screening test - Hepatitis C antibody  10. Hip pain Ice massage, especially when he knows he'll be sitting for more than about 15 minutes.  If persists, evaluate with his orthopedic specialist. - DG Hip Complete Left; Future  Return in about 6 months (around 05/27/2014).  Fara Chute, PA-C Physician Assistant-Certified [facility]

## 2013-11-25 LAB — PSA: PSA: 1.37 ng/mL (ref <=4.00)

## 2013-12-08 ENCOUNTER — Other Ambulatory Visit: Payer: Self-pay | Admitting: Physician Assistant

## 2013-12-25 ENCOUNTER — Ambulatory Visit (AMBULATORY_SURGERY_CENTER): Payer: Self-pay | Admitting: *Deleted

## 2013-12-25 ENCOUNTER — Telehealth: Payer: Self-pay | Admitting: *Deleted

## 2013-12-25 VITALS — Ht 70.0 in | Wt 200.2 lb

## 2013-12-25 DIAGNOSIS — K2270 Barrett's esophagus without dysplasia: Secondary | ICD-10-CM

## 2013-12-25 NOTE — Telephone Encounter (Signed)
Yes, ok to hold Pradaxa for 2 days with cardiology clearance from April

## 2013-12-25 NOTE — Telephone Encounter (Signed)
Dr Fuller Plan: pt is scheduled for recall EGD for Barrett's 01/23/2014.  Pt takes Pradaxa for hx CVA.  Pt had clearance to hold Pradaxa from Dr. Rayann Heman after OV with Janett Billow in April. Pt has had no changes to cardiac status since April.  Ok for proceed with instructions from Dr Rayann Heman to hold Pradaxa for 2 days prior to EGD?  Thanks, Juliann Pulse

## 2013-12-25 NOTE — Telephone Encounter (Signed)
Noted.  Pt aware to stop Pradaxa 2 days before procedure

## 2013-12-25 NOTE — Progress Notes (Signed)
No allergies to eggs or soy. No problems with anesthesia.  Pt given Emmi instructions for EGD  No oxygen use  No diet drug use  

## 2013-12-30 ENCOUNTER — Ambulatory Visit: Payer: Medicare Other | Admitting: Internal Medicine

## 2014-01-06 ENCOUNTER — Other Ambulatory Visit: Payer: Self-pay | Admitting: Internal Medicine

## 2014-01-11 ENCOUNTER — Other Ambulatory Visit: Payer: Self-pay | Admitting: Internal Medicine

## 2014-01-22 NOTE — Telephone Encounter (Signed)
Close Encounter 

## 2014-01-23 ENCOUNTER — Encounter: Payer: Medicare Other | Admitting: Gastroenterology

## 2014-01-23 ENCOUNTER — Encounter: Payer: Self-pay | Admitting: Gastroenterology

## 2014-01-23 ENCOUNTER — Ambulatory Visit (AMBULATORY_SURGERY_CENTER): Payer: Medicare Other | Admitting: Gastroenterology

## 2014-01-23 VITALS — BP 154/93 | HR 62 | Temp 97.5°F | Resp 9

## 2014-01-23 DIAGNOSIS — K2270 Barrett's esophagus without dysplasia: Secondary | ICD-10-CM

## 2014-01-23 MED ORDER — SODIUM CHLORIDE 0.9 % IV SOLN: 500.0000 mL | INTRAVENOUS | Status: DC

## 2014-01-23 NOTE — Op Note (Signed)
[facility]  [address] [place], [address]   ENDOSCOPY PROCEDURE REPORT  PATIENT: Calvin, Bryan  MR#: [id number] BIRTHDATE: 06/08/1946 , 62  yrs. old GENDER: Male ENDOSCOPIST: Ladene Artist, MD, [facility] PROCEDURE DATE:  01/23/2014 PROCEDURE:  EGD w/ biopsy ASA CLASS:     Class III INDICATIONS:  history of Barrett's esophagus. MEDICATIONS: MAC sedation, administered by CRNA and propofol (Diprivan) 140 mg IV TOPICAL ANESTHETIC: none DESCRIPTION OF PROCEDURE: After the risks benefits and alternatives of the procedure were thoroughly explained, informed consent was obtained.  The LB [id number] endoscope was introduced through the mouth and advanced to the second portion of the duodenum. Without limitations.  The instrument was slowly withdrawn as the mucosa was fully examined.  ESOPHAGUS: There was evidence of Barrett's esophagus in the lower third of the esophagus from  34-37 cm.  Multiple biopsies were performed every 1 cm.   The esophagus was otherwise normal. STOMACH: Mild gastritis was found in the gastric body and gastric fundus.   Multiple sessile polyps ranging between 3-36mm in size were found in the gastric body and gastric fundus.  Gastritis and polyps previously biopsied. The stomach otherwise appeared normal.  DUODENUM: The duodenal mucosa showed no abnormalities in the bulb and second portion of the duodenum.  Retroflexed views revealed a small hiatal hernia.     The scope was then withdrawn from the patient and the procedure completed.  COMPLICATIONS: There were no complications.  ENDOSCOPIC IMPRESSION: 1.   Barrett's esophagus; multiple biopsies 2.   Gastritis in the gastric body and gastric fundus 3.   Multiple sessile polyps ranging between 3-29mm in the gastric body and gastric fundus 4.   Smal hiatal hernia  RECOMMENDATIONS: 1.  Await pathology results 2.  Anti-reflux regimen 3.  Continue PPI 4.  Resume Pradaxa tomorrow 5.   EGD in 3 years if no dysplasia  eSigned:  Ladene Artist, MD, Marval Regal 01/23/2014 3:21 PM   CC: Ivar Bury, MD

## 2014-01-23 NOTE — Progress Notes (Signed)
Report to PACU, RN, vss, BBS= Clear.  

## 2014-01-23 NOTE — Progress Notes (Signed)
NO COMPLAINTS NOTED IN THE RECOVERY ROOM. MAW

## 2014-01-23 NOTE — Patient Instructions (Signed)
YOU HAD AN ENDOSCOPIC PROCEDURE TODAY AT THE [facility]: Refer to the procedure report that was given to you for any specific questions about what was found during the examination.  If the procedure report does not answer your questions, please call your gastroenterologist to clarify.  If you requested that your care partner not be given the details of your procedure findings, then the procedure report has been included in a sealed envelope for you to review at your convenience later.  YOU SHOULD EXPECT: Some feelings of bloating in the abdomen. Passage of more gas than usual.  Walking can help get rid of the air that was put into your GI tract during the procedure and reduce the bloating. If you had a lower endoscopy (such as a colonoscopy or flexible sigmoidoscopy) you may notice spotting of blood in your stool or on the toilet paper. If you underwent a bowel prep for your procedure, then you may not have a normal bowel movement for a few days.  DIET: Your first meal following the procedure should be a light meal and then it is ok to progress to your normal diet.  A half-sandwich or bowl of soup is an example of a good first meal.  Heavy or fried foods are harder to digest and may make you feel nauseous or bloated.  Likewise meals heavy in dairy and vegetables can cause extra gas to form and this can also increase the bloating.  Drink plenty of fluids but you should avoid alcoholic beverages for 24 hours.  ACTIVITY: Your care partner should take you home directly after the procedure.  You should plan to take it easy, moving slowly for the rest of the day.  You can resume normal activity the day after the procedure however you should NOT DRIVE or use heavy machinery for 24 hours (because of the sedation medicines used during the test).    SYMPTOMS TO REPORT IMMEDIATELY: A gastroenterologist can be reached at any hour.  During normal business hours, 8:30 AM to 5:00 PM Monday through Friday,  call [phone].  After hours and on weekends, please call the GI answering service at [phone] who will take a message and have the physician on call contact you.    Following upper endoscopy (EGD)  Vomiting of blood or coffee ground material  New chest pain or pain under the shoulder blades  Painful or persistently difficult swallowing  New shortness of breath  Fever of 100F or higher  Black, tarry-looking stools  FOLLOW UP: If any biopsies were taken you will be contacted by phone or by letter within the next 1-3 weeks.  Call your gastroenterologist if you have not heard about the biopsies in 3 weeks.  Our staff will call the home number listed on your records the next business day following your procedure to check on you and address any questions or concerns that you may have at that time regarding the information given to you following your procedure. This is a courtesy call and so if there is no answer at the home number and we have not heard from you through the emergency physician on call, we will assume that you have returned to your regular daily activities without incident.  SIGNATURES/CONFIDENTIALITY: You and/or your care partner have signed paperwork which will be entered into your electronic medical record.  These signatures attest to the fact that that the information above on your After Visit Summary has been reviewed and is understood.  Full   responsibility of the confidentiality of this discharge information lies with you and/or your care-partner.     Handouts were given to your care partner on GERD, hiatal hernia, gastritis, and Barrett's esophagus. You may resume your Pradaxa tomorrow.  Continue taking your acid medication.  You may resume your other current medications today. Await biopsy results. Please call if any questions or concerns.

## 2014-01-23 NOTE — Progress Notes (Signed)
Called to room to assist during endoscopic procedure.  Patient ID and intended procedure confirmed with present staff. Received instructions for my participation in the procedure from the performing physician.  

## 2014-01-26 ENCOUNTER — Telehealth: Payer: Self-pay | Admitting: *Deleted

## 2014-01-26 NOTE — Telephone Encounter (Signed)
  Follow up Call-  Call back number 01/23/2014  Post procedure Call Back phone  # [phone]  Permission to leave phone message Yes  comments with wife OK to leave message.     Patient questions:  Do you have a fever, pain , or abdominal swelling? No. Pain Score  0 *  Have you tolerated food without any problems? Yes.    Have you been able to return to your normal activities? Yes.    Do you have any questions about your discharge instructions: Diet   No. Medications  No. Follow up visit  No.  Do you have questions or concerns about your Care? No.  Actions: * If pain score is 4 or above: No action needed, pain <4.

## 2014-02-02 ENCOUNTER — Encounter: Payer: Self-pay | Admitting: Gastroenterology

## 2014-02-22 ENCOUNTER — Other Ambulatory Visit: Payer: Self-pay | Admitting: Physician Assistant

## 2014-02-24 NOTE — Telephone Encounter (Signed)
Faxed

## 2014-02-26 ENCOUNTER — Ambulatory Visit: Payer: Medicare Other | Admitting: Internal Medicine

## 2014-02-26 ENCOUNTER — Ambulatory Visit (INDEPENDENT_AMBULATORY_CARE_PROVIDER_SITE_OTHER): Payer: Medicare Other | Admitting: Internal Medicine

## 2014-02-26 ENCOUNTER — Encounter: Payer: Self-pay | Admitting: Internal Medicine

## 2014-02-26 VITALS — BP 130/78 | HR 64 | Ht 69.0 in | Wt 194.0 lb

## 2014-02-26 VITALS — BP 146/80 | HR 70 | Ht 68.0 in | Wt 194.0 lb

## 2014-02-26 DIAGNOSIS — I480 Paroxysmal atrial fibrillation: Secondary | ICD-10-CM

## 2014-02-26 DIAGNOSIS — Z950 Presence of cardiac pacemaker: Secondary | ICD-10-CM

## 2014-02-26 DIAGNOSIS — I4891 Unspecified atrial fibrillation: Secondary | ICD-10-CM

## 2014-02-26 DIAGNOSIS — I495 Sick sinus syndrome: Secondary | ICD-10-CM

## 2014-02-26 DIAGNOSIS — G4733 Obstructive sleep apnea (adult) (pediatric): Secondary | ICD-10-CM

## 2014-02-26 LAB — MDC_IDC_ENUM_SESS_TYPE_INCLINIC
Battery Voltage: 2.75 V
Date Time Interrogation Session: 20150820110723
Implantable Pulse Generator Serial Number: 1209334
Lead Channel Pacing Threshold Amplitude: 0.75 V
Lead Channel Pacing Threshold Amplitude: 0.75 V
Lead Channel Pacing Threshold Pulse Width: 0.5 ms
Lead Channel Pacing Threshold Pulse Width: 0.5 ms
Lead Channel Sensing Intrinsic Amplitude: 11.1 mV
Lead Channel Setting Pacing Amplitude: 2 V
Lead Channel Setting Pacing Amplitude: 2.5 V
Lead Channel Setting Pacing Pulse Width: 0.5 ms
Lead Channel Setting Sensing Sensitivity: 2 mV
MDC IDC MSMT BATTERY IMPEDANCE: 1500 Ohm
MDC IDC MSMT LEADCHNL RA IMPEDANCE VALUE: 471 Ohm
MDC IDC MSMT LEADCHNL RA SENSING INTR AMPL: 3.6 mV
MDC IDC MSMT LEADCHNL RV IMPEDANCE VALUE: 453 Ohm

## 2014-02-26 NOTE — Patient Instructions (Addendum)
Your physician recommends that you schedule a follow-up appointment in: 6 months with Dr. Klein Your physician recommends that you continue on your current medications as directed. Please refer to the Current Medication list given to you today.  

## 2014-02-26 NOTE — Patient Instructions (Signed)
We can continue CPAP 8  You can return here for follow-up if you wish, but ultimately you will want to have a doctor and DME company in [place] to manage your sleep apnea care.

## 2014-02-26 NOTE — Progress Notes (Signed)
Subjective:    Patient ID: [name], male    DOB: 15-Mar-1946, 68 y.o.   MRN: [id number]  HPI 11/16/10-65 yoM with OSA and asthma, complicated by atrial fib/ pacemaker and hx CVA.  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 Since then he got through the winter well.  He denies dyspnea. A few wheeze-like sensations do not respond to rescue inhaler. Admits some nasal congestion. He used his Nasonex for a few days. CPAP continues all night every night at 7/ Advanced. He has been tempted to try a bit higher.   11/16/11- 37 yoM with OSA and asthma, complicated by atrial fib/ pacemaker and hx CVA.  Scuba diver Stomach sleeper but able to readjust to be able to use CPAP every night Not diving much until weather warms up. Pending ablation/Dr Alred Using CPAP all night every night 8/Advanced. Wife, and this wheeze in the mornings. He doesn't notice  12/31/12- 67 yoM with OSA and asthma, complicated by atrial fib/ pacemaker and hx CVA.  Scuba diver FOLLOWS FOR: wears CPAP 8/ Advanced every night for about 6-8 hours; pressure works well for patient; gets dry mouth Still active diver despite his history of atrial fibrillation/pacemaker. Has gone to a hypnotist to lose weight. Nasonex has worked well. Has not needed his rescue inhaler.  02/26/14- 46 yoM with OSA and asthma, complicated by atrial fib/ pacemaker and hx CVA.  [place]uba diver Pt states he is wearing CPAP 8/ Advanced nightly for 6-8 hours. Pt deneis issues with machine, mask or pressure. Pt states he is moving to [facility].    ROS-see HPI Constitutional:   No-   weight loss, night sweats, fevers, chills, fatigue, lassitude. HEENT:   No-  headaches, difficulty swallowing, tooth/dental problems, sore throat,       No-  sneezing, itching, ear ache, +nasal congestion, post nasal drip,  CV:  No-   chest pain, orthopnea, PND, swelling in lower extremities, anasarca, dizziness, palpitations Resp: No-   shortness of breath with exertion  or at rest.              No-   productive cough,  No non-productive cough,  No- coughing up of blood.              No-   change in color of mucus.  No- wheezing.   Skin: No-   rash or lesions. GI:  No-   heartburn, indigestion, abdominal pain, nausea, vomiting,  GU:  MS:  No-   joint pain or swelling.   Neuro-     nothing unusual Psych:  No- change in mood or affect. No depression or anxiety.  No memory loss.  OBJ- Physical Exam General- Alert, Oriented, Affect-appropriate, Distress- none acute. Fit appearing. Skin- rash-none, lesions- none, excoriation- none Lymphadenopathy- none Head- atraumatic            Eyes- Gross vision intact, PERRLA, conjunctivae and secretions clear            Ears- Hearing, canals-normal            Nose- Clear, no-Septal dev, mucus, polyps, erosion, perforation             Throat- Mallampati II , mucosa clear , drainage+mucoid, tonsils- atrophic Neck- flexible , trachea midline, no stridor , thyroid nl, carotid no bruit Chest - symmetrical excursion , unlabored           Heart/CV- RRR/paced , no murmur , no gallop  , no rub,  nl s1 s2                           - JVD- none , edema- none, stasis changes- none, varices- none           Lung- clear to P&A, wheeze- none, cough- none , dullness-none, rub- none           Chest wall- pacemaker L Abd-  Br/ Gen/ Rectal- Not done, not indicated Extrem- cyanosis- none, clubbing, none, atrophy- none, strength- nl Neuro- grossly intact to observation Assessment & Plan:

## 2014-02-26 NOTE — Progress Notes (Signed)
Patient Care Team: Darlyne Russian, MD as PCP - General (Family Medicine)   HPI  [name] is a 68 y.o. male Seen in followup for atrial fibrillation. He also had post termination pauses and undergone pacemaker implantation.  He underwent catheter ablation 5/13 for his atrial fibrillation by Dr. Greggory Brandy  And then repeat 11/13 \  He has been maintained on Pradaxa; propafenone has been discontinued. He last saw Dr. Greggory Brandy 2/15 without intercurrent atrial fibrillation  The patient denies chest pain, shortness of breath, nocturnal dyspnea, orthopnea or peripheral edema.  There have been no palpitations, lightheadedness or syncope.   Undergone a great deal of change, he has sold his boat and his beach house and his local house and bought his new house  in [place].       Past Medical History  Diagnosis Date  . Atrial fibrillation   . OSA (obstructive sleep apnea)     on CPAP  . Allergic rhinitis   . Hyperlipidemia   . Asthma   . GERD (gastroesophageal reflux disease)   . Barrett's esophagus 03/1993  . RLS (restless legs syndrome)   . Tubular adenoma polyp of rectum 07/1998  . Hiatal hernia   . Hemorrhoids   . AVM (arteriovenous malformation)   . Cataract   . Hypertension   . Sinoatrial node dysfunction     s/p PPM  . Stroke 2008    A-Fib  . Gallstones   . Kidney stones   . Sleep apnea     cpap  . Arthritis     Past Surgical History  Procedure Laterality Date  . Cholecystectomy open    . Knee arthroscopy Left     ACL repair also  . Tonsillectomy    . Pacemaker insertion  11/15/07    St. Jude Dr. Remer Macho implanted by Dr Caryl Comes  . Tee without cardioversion  11/27/2011    Procedure: TRANSESOPHAGEAL ECHOCARDIOGRAM (TEE);  Surgeon: Fay Records, MD;  Location: [facility] END[facility]COPY;  Service: Cardiovascular;  Laterality: N/A;  . Atrial fibrillation ablation  11/28/11 and 05/28/12    Afib ablation x 2 by Dr Rayann Heman  . Tee without cardioversion  05/28/2012    Procedure:  TRANSESOPHAGEAL ECHOCARDIOGRAM (TEE);  Surgeon: Larey Dresser, MD;  Location: [facility] ENDOSCOPY;  Service: Cardiovascular;  Laterality: N/A;  Dondra Spry Greggory Brandy     Current Outpatient Prescriptions  Medication Sig Dispense Refill  . AMBULATORY NON FORMULARY MEDICATION CPAP machine qhs      . ANDROGEL PUMP 20.25 MG/ACT (1.62%) GEL USE 1 PUMP TO EACH AXILLA DAILY  75 g  0  . colesevelam (WELCHOL) 625 MG tablet Take 3 tablets (1,875 mg total) by mouth 2 (two) times daily with a meal.  540 tablet  3  . ezetimibe (ZETIA) 10 MG tablet Take 1 tablet (10 mg total) by mouth daily.  90 tablet  3  . fenofibrate 160 MG tablet Take 1 tablet (160 mg total) by mouth daily.  90 tablet  3  . fluvoxaMINE (LUVOX) 100 MG tablet Take 100 mg QAM, take 50 mg QHS  135 tablet  1  . glucosamine-chondroitin 500-400 MG tablet Take 1 tablet by mouth 2 (two) times daily.        Marland Kitchen losartan (COZAAR) 100 MG tablet Take 1 tablet (100 mg total) by mouth daily.  90 tablet  3  . mometasone (NASONEX) 50 MCG/ACT nasal spray 1-2 puffs each nostril once every night at bedtime  17 g  6  . Multiple Vitamin (MULITIVITAMIN WITH MINERALS) TABS Take 1 tablet by mouth daily.      . Omega-3 Fatty Acids (FISH OIL) 1000 MG CAPS Take 1,000 mg by mouth 2 (two) times daily.       Marland Kitchen omeprazole (PRILOSEC) 40 MG capsule Take 1 capsule (40 mg total) by mouth daily.  90 capsule  3  . PRADAXA 150 MG CAPS capsule TAKE ONE CAPSULE BY MOUTH TWICE A DAY  180 capsule  0  . rOPINIRole (REQUIP) 3 MG tablet Take 1 tablet (3 mg total) by mouth at bedtime.  90 tablet  1  . sildenafil (VIAGRA) 100 MG tablet 1/2 tablet as directed  10 tablet  3   Current Facility-Administered Medications  Medication Dose Route Frequency Provider Last Rate Last Dose  . pneumococcal 23 valent vaccine (PNU-IMMUNE) injection 0.5 mL  0.5 mL Intramuscular Tomorrow-1000 Posey Boyer, MD        Allergies  Allergen Reactions  . Niacin Itching  . Statins     Liver damage    Review of  Systems negative except from HPI and PMH  Physical Exam BP 146/80  Pulse 70  Ht 5\' 8"  (1.727 m)  Wt 194 lb (87.998 kg)  BMI 29.50 kg/m2 Well developed and well nourished in no acute distress HENT normal E scleral and icterus clear Neck Supple JVP flat; carotids brisk and full Clear to ausculation Device pocket well healed; without hematoma or erythema.  There is no tethering  Regular rate and rhythm, 2/6 systolic murmurs gallops or rub Soft with active bowel sounds No clubbing cyanosis  Edema Alert and oriented, grossly normal motor and sensory function Skin Warm and Dry  ECG demonstrates sinus rhythm at 70 Intervals 16/10/39  Assessment and  Plan  Atrial fibrillation-paroxysmal status post PVI x2  Sinus node dysfunction with post termination pausing  Pacemaker-St. Jude The patient's device was interrogated.  The information was reviewed. No changes were made in the programming.     Hypertension  Aortic sclerosis  Patient is doing relatively well; there has been no significant interval atrial fibrillation. We will continue him on anticoagulation. Echo 2013 demonstrated no aortic stenosis but some aortic sclerosis. He'll ultimately need repeat echo but not necessary at this time.  Blood pressures borderline elevated but he seems to think that higher here at home  He is moving to [place]. He'll transfer of care that gradually.

## 2014-03-02 ENCOUNTER — Encounter: Payer: Self-pay | Admitting: Internal Medicine

## 2014-03-02 ENCOUNTER — Telehealth: Payer: Self-pay

## 2014-03-02 DIAGNOSIS — I10 Essential (primary) hypertension: Secondary | ICD-10-CM

## 2014-03-02 MED ORDER — LOSARTAN POTASSIUM 100 MG PO TABS: 100.0000 mg | ORAL_TABLET | Freq: Every day | ORAL | Status: DC

## 2014-03-02 NOTE — Telephone Encounter (Signed)
[place],needs his Len Childs [facility] at [phone] or fax [phone]   Pt phone [phone]

## 2014-03-02 NOTE — Telephone Encounter (Signed)
Sent in remaining supply that was sent in May- Advised pt to establish care with a provider in [place].

## 2014-04-14 ENCOUNTER — Telehealth: Payer: Self-pay | Admitting: Internal Medicine

## 2014-04-14 DIAGNOSIS — G4733 Obstructive sleep apnea (adult) (pediatric): Secondary | ICD-10-CM

## 2014-04-14 NOTE — Telephone Encounter (Signed)
Spoke with pt-- has moved to [place] permanently.  States that he has not established with a Pulmonologist as of yet.  Pt states that records were supposed to be sent to Pulmonary Office in [place] of titration report and an order for cpap supplies. Pt states that he is needing this Rx to help get him by until he can get established with new Pulm, who will then take over management of CPAP and supplies. Needs this faxed to [facility] in [place]  F# [phone] attn becky  Please advise Dr Annamaria Boots. Thanks.

## 2014-04-14 NOTE — Telephone Encounter (Signed)
I have sent a staff message to [facility] to help with this. Will hold in my basket until completed. Thanks.

## 2014-04-14 NOTE — Telephone Encounter (Signed)
Need to see if [name] still has any CPAP compliance download data on file.  When this comes, we can send order for CPAP mask of choice, supplies, as requested to the [place] DME company for Dx OSA. Also should send a copy of his diagnostic NPSG reports.

## 2014-04-21 NOTE — Telephone Encounter (Signed)
Katie, please advise status of this message. Can it be closed?

## 2014-04-22 ENCOUNTER — Encounter: Payer: Self-pay | Admitting: Internal Medicine

## 2014-04-24 ENCOUNTER — Telehealth: Payer: Self-pay | Admitting: Internal Medicine

## 2014-04-24 DIAGNOSIS — G4733 Obstructive sleep apnea (adult) (pediatric): Secondary | ICD-10-CM

## 2014-04-24 NOTE — Telephone Encounter (Signed)
Order- Autotitration 5-20 cwp.   Dx OSA

## 2014-04-24 NOTE — Telephone Encounter (Signed)
Please advise on CPAP settings thanks

## 2014-04-24 NOTE — Telephone Encounter (Signed)
Called, spoke with [facility] with [facility].  Informed her of below per Dr. Annamaria Boots.  She verbalized understanding, is aware PCCs will fax order, and voiced no further questions or concerns at this time.

## 2014-06-18 ENCOUNTER — Encounter (HOSPITAL_COMMUNITY): Payer: Self-pay | Admitting: Internal Medicine

## 2014-06-18 ENCOUNTER — Telehealth: Payer: Self-pay

## 2014-06-18 NOTE — Telephone Encounter (Signed)
LMVM reminding patient to get his flu shot. 

## 2014-06-22 ENCOUNTER — Other Ambulatory Visit: Payer: Self-pay | Admitting: Physician Assistant

## 2014-07-13 ENCOUNTER — Other Ambulatory Visit: Payer: Self-pay | Admitting: Internal Medicine

## 2014-07-20 ENCOUNTER — Telehealth: Payer: Self-pay | Admitting: *Deleted

## 2014-07-20 NOTE — Telephone Encounter (Signed)
Flu shot received in Oct. 2015. Pt has moved to [place].

## 2014-09-02 ENCOUNTER — Encounter: Payer: Self-pay | Admitting: *Deleted

## 2014-09-14 ENCOUNTER — Telehealth: Payer: Self-pay | Admitting: Internal Medicine

## 2014-09-14 NOTE — Telephone Encounter (Signed)
New message      Pt want [place] to know that he is now living in [place] and has a cardiologist here. He got a letter stating we have not received his pacemaker ck. Please update our records.  He also said tell Janan Halter hello.

## 2014-09-14 NOTE — Telephone Encounter (Signed)
Noted in paceart.  

## 2014-11-17 ENCOUNTER — Other Ambulatory Visit: Payer: Self-pay | Admitting: Physician Assistant

## 2014-11-17 DIAGNOSIS — I10 Essential (primary) hypertension: Secondary | ICD-10-CM

## 2014-11-17 MED ORDER — LOSARTAN POTASSIUM 100 MG PO TABS: 100.0000 mg | ORAL_TABLET | Freq: Every day | ORAL | Status: AC

## 2014-11-17 NOTE — Addendum Note (Signed)
Addended by: Jannette Spanner on: 11/17/2014 05:12 PM   Modules accepted: Orders

## 2014-11-17 NOTE — Telephone Encounter (Signed)
Dr. Everlene Farrier did you want to refill, has not been seen in almost a year.

## 2014-11-17 NOTE — Telephone Encounter (Signed)
I called pt and he states he lives in [place] and we should not receive anymore request. I refilled for 8 weeks until he can see his doctor there. FYI

## 2014-11-17 NOTE — Telephone Encounter (Signed)
Call patient and tell him he needs to get in to be seen. He does see the cardiologist but has not been seen here for a year. We can go ahead and give him 8 weeks of medication to last him until he can get in to be seen.

## 2014-11-18 NOTE — Telephone Encounter (Signed)
We can get this patient 2 months of medication but he does need to return to clinic.

## 2014-11-30 ENCOUNTER — Other Ambulatory Visit: Payer: Self-pay | Admitting: Physician Assistant

## 2015-01-04 ENCOUNTER — Other Ambulatory Visit: Payer: Self-pay

## 2015-01-07 ENCOUNTER — Encounter: Payer: Self-pay | Admitting: Gastroenterology

## 2015-01-12 ENCOUNTER — Other Ambulatory Visit: Payer: Self-pay | Admitting: Internal Medicine

## 2015-02-10 ENCOUNTER — Telehealth: Payer: Self-pay | Admitting: Internal Medicine

## 2015-02-10 NOTE — Telephone Encounter (Signed)
[facility] from [place] is calling to obtain records for patient care in [place]. Sent note to medical records to provide information needed. Called patient and he verbally agreed ok to send records.  Nothing further needed.

## 2015-02-13 ENCOUNTER — Other Ambulatory Visit: Payer: Self-pay | Admitting: Internal Medicine

## 2015-03-15 ENCOUNTER — Other Ambulatory Visit: Payer: Self-pay | Admitting: Internal Medicine

## 2015-03-16 ENCOUNTER — Telehealth: Payer: Self-pay

## 2015-03-16 NOTE — Telephone Encounter (Signed)
Patient has moved to [place], and established with a cardiologist there. No further refills from our office.

## 2015-04-04 IMAGING — CR DG HIP (WITH OR WITHOUT PELVIS) 2-3V*L*
2 series · 2 of 2 positions shown · non-contrast
Comparison: None.

CLINICAL DATA: Left hip pain

EXAM:
LEFT HIP - COMPLETE 2+ VIEW

[lateral]
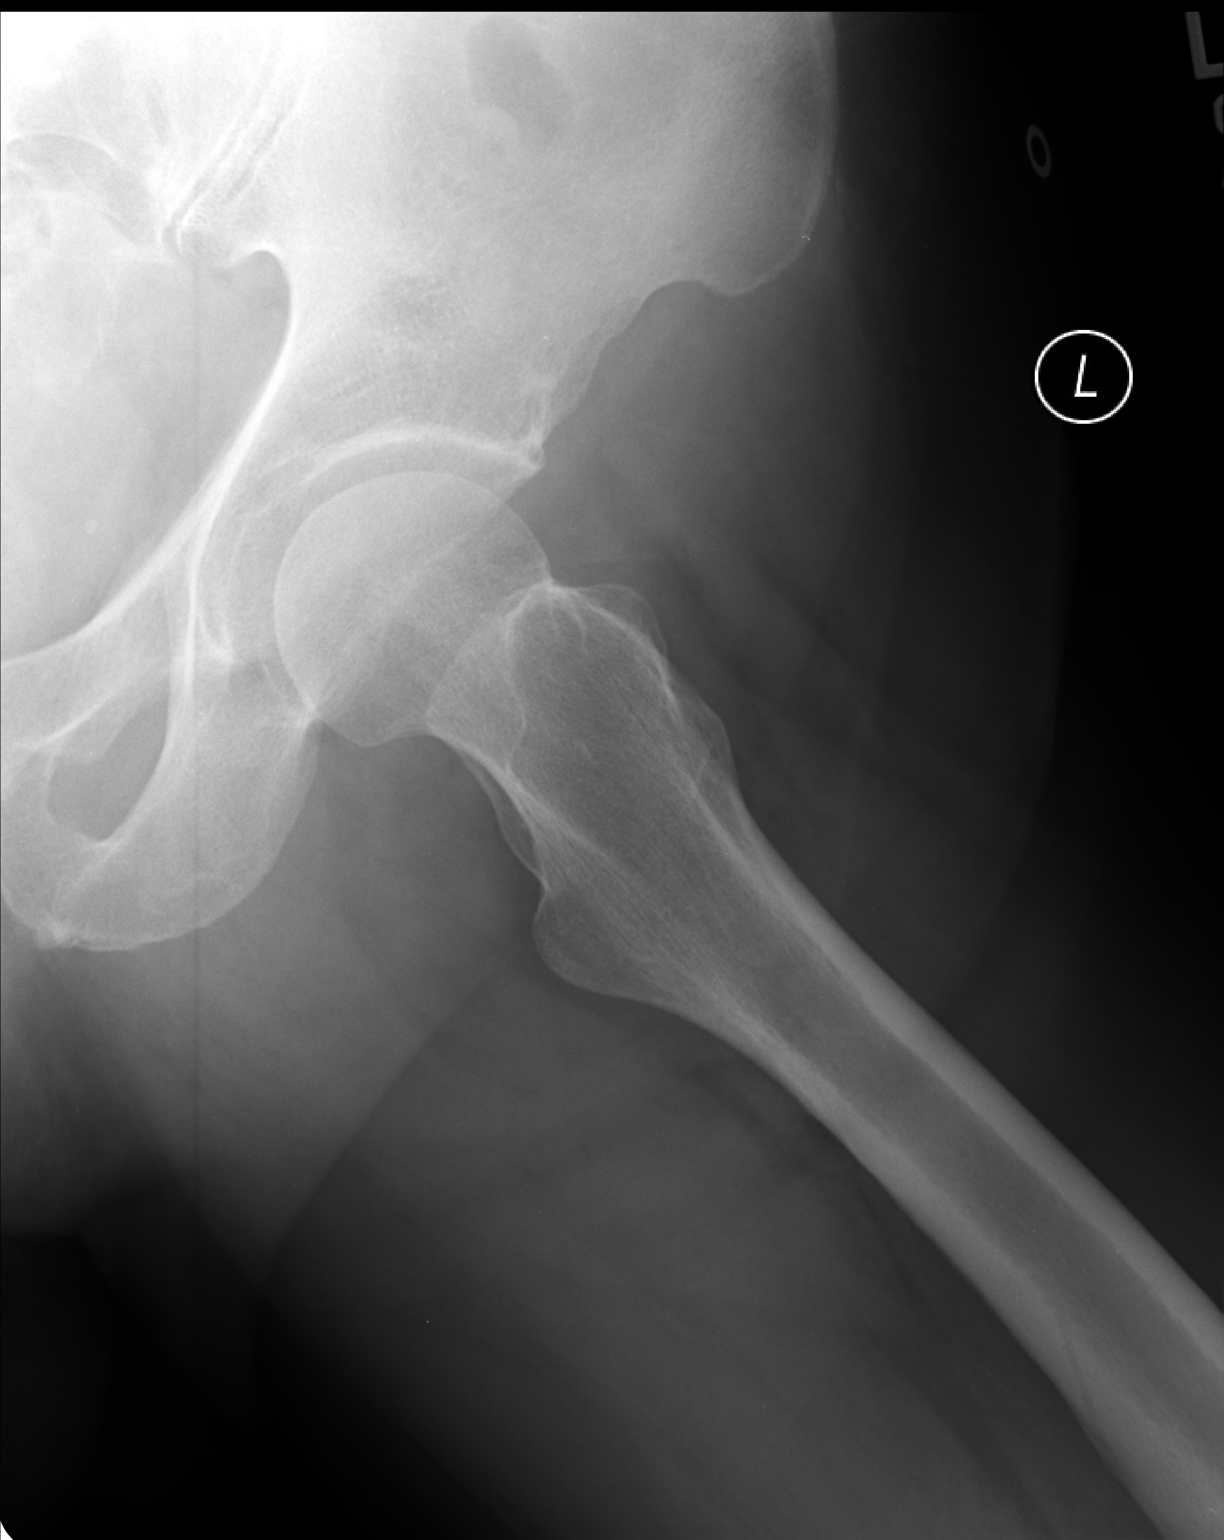

[AP]
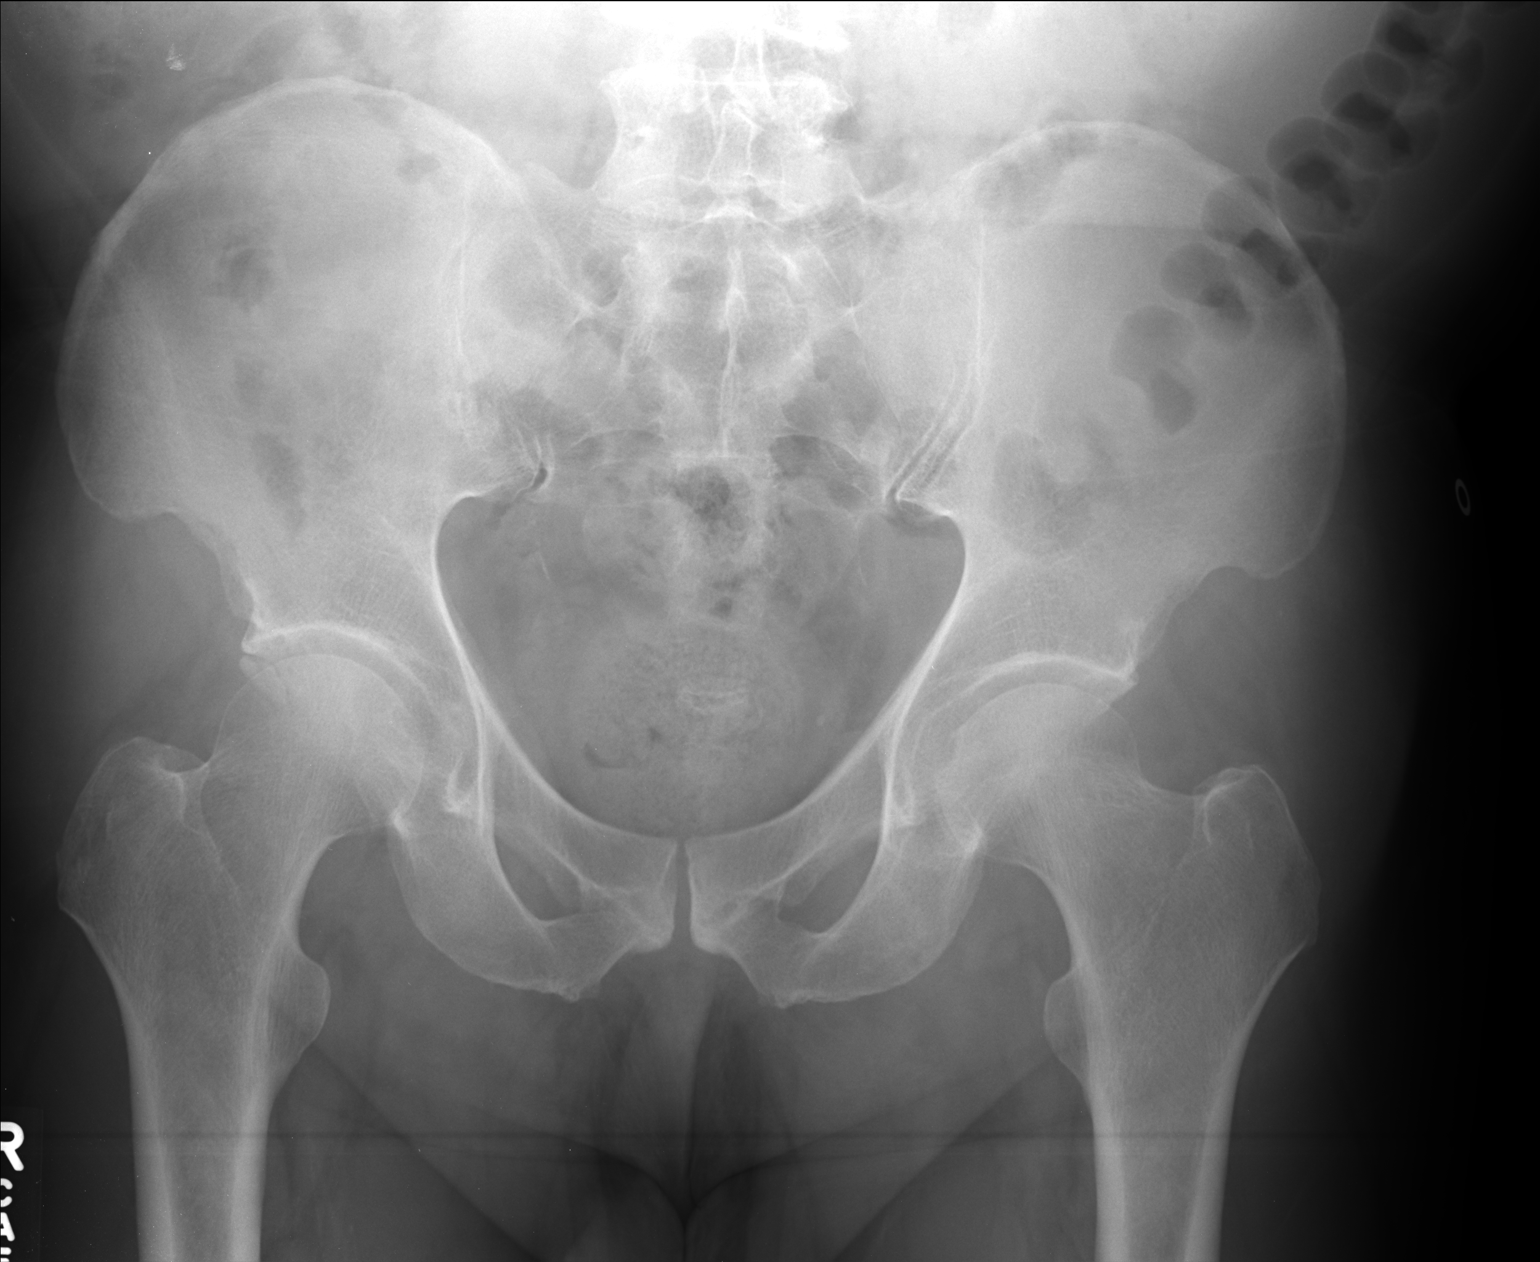

[2 of 2 positions shown; findings below may reference images not displayed]

FINDINGS: There is no evidence of hip fracture or dislocation. There is no
evidence of arthropathy or other focal bone abnormality.
IMPRESSION: No acute abnormality noted.

## 2015-04-13 ENCOUNTER — Encounter: Payer: Self-pay | Admitting: Emergency Medicine

## 2015-10-19 ENCOUNTER — Encounter: Payer: Self-pay | Admitting: Gastroenterology

## 2016-06-19 ENCOUNTER — Telehealth: Payer: Self-pay

## 2016-06-19 NOTE — Telephone Encounter (Signed)
PATIENT WOULD LIKE TO ASK [name] WHAT IS THE NAME OF THE MEDICATION SHE PRESCRIBED FOR HIM SEVERAL YEARS AGO FOR OCD? HE SAID HE HAS MOVED TO [place] NOW AND HIS PCP WOULD LIKE TO KNOW. BEST PHONE [phone] (CELL)  [place]

## 2016-06-22 NOTE — Telephone Encounter (Signed)
Left Vm informing pt that the med was fluvoxaMINE (LUVOX) 100 MG tablet [id number]

## 2016-12-01 ENCOUNTER — Encounter: Payer: Self-pay | Admitting: Gastroenterology

## 2024-07-10 ENCOUNTER — Ambulatory Visit (HOSPITAL_COMMUNITY): Admission: EM | Admit: 2024-07-10 | Discharge: 2024-07-10 | Disposition: A | Discharge status: Home / Self Care

## 2024-07-10 ENCOUNTER — Encounter (HOSPITAL_COMMUNITY): Payer: Self-pay | Admitting: Emergency Medicine

## 2024-07-10 ENCOUNTER — Other Ambulatory Visit: Payer: Self-pay

## 2024-07-10 DIAGNOSIS — J069 Acute upper respiratory infection, unspecified: Secondary | ICD-10-CM

## 2024-07-10 DIAGNOSIS — R6889 Other general symptoms and signs: Secondary | ICD-10-CM | POA: Diagnosis not present

## 2024-07-10 LAB — POCT INFLUENZA A/B
Influenza A, POC: NEGATIVE
Influenza B, POC: NEGATIVE

## 2024-07-10 LAB — POC SOFIA SARS ANTIGEN FIA: SARS Coronavirus 2 Ag: NEGATIVE

## 2024-07-10 NOTE — ED Provider Notes (Signed)
 "    [facility]    CSN: [id number] Arrival date & time: 07/10/24  1539    HISTORY   Chief Complaint  Patient presents with   Cough   HPI [name] is a pleasant, 79 y.o. male who presents to urgent care today. Patient reports exposure to family member who had flu A and pneumonia 2 weeks ago.  Patient states that today he had acute onset of cough, runny nose and sneezing.  Patient states he is visiting from [place]  and staying in the home with a family member with the flu and pneumonia at this time.  Patient states he did get his annual flu vaccine this year.  Patient has elevated heart rate on arrival with otherwise normal vital signs.  Patient denies fever, body aches, chills, nausea, vomiting, diarrhea, shortness of breath.  The history is provided by the patient.  Cough  Past Medical History:  Diagnosis Date   Allergic rhinitis    Arthritis    Asthma    Atrial fibrillation (HCC)    AVM (arteriovenous malformation)    Barrett's esophagus 03/1993   Cataract    Gallstones    GERD (gastroesophageal reflux disease)    Hemorrhoids    Hiatal hernia    Hyperlipidemia    Hypertension    Kidney stones    OSA (obstructive sleep apnea)    on CPAP   RLS (restless legs syndrome)    Sinoatrial node dysfunction (HCC)    s/p PPM   Sleep apnea    cpap   Stroke [facility]) 2008   A-Fib   Tubular adenoma polyp of rectum 07/1998   Patient Active Problem List   Diagnosis Date Noted   OCD (obsessive compulsive disorder) 11/24/2013   Erectile dysfunction 11/24/2013   Essential hypertension 08/17/2013   Cough 04/16/2012   Sinoatrial node dysfunction (HCC)    Esophagus, Barrett's 07/26/2011   Hernia, hiatal 07/26/2011   Hypogonadism male 07/26/2011   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   SYNCOPE 03/29/2010   DYSPNEA ON EXERTION 01/03/2010   Asthma, mild intermittent 07/23/2009   C V A / STROKE 07/25/2007   Seasonal and perennial  allergic rhinitis 07/25/2007   HYPERLIPIDEMIA 07/24/2007   Obstructive sleep apnea 07/24/2007   RESTLESS LEGS SYNDROME 07/24/2007   Atrial fibrillation (HCC) 07/24/2007   GERD 07/24/2007   Past Surgical History:  Procedure Laterality Date   Atrial fibrillation ablation  11/28/11 and 05/28/12   Afib ablation x 2 by Dr Kelsie   ATRIAL FIBRILLATION ABLATION N/A 11/28/2011   Procedure: ATRIAL FIBRILLATION ABLATION;  Surgeon: Lynwood Kelsie, MD;  Location: [facility] CATH LAB;  Service: Cardiovascular;  Laterality: N/A;   ATRIAL FIBRILLATION ABLATION N/A 05/28/2012   Procedure: ATRIAL FIBRILLATION ABLATION;  Surgeon: Lynwood Kelsie, MD;  Location: [facility] CATH LAB;  Service: Cardiovascular;  Laterality: N/A;   CHOLECYSTECTOMY OPEN     KNEE ARTHROSCOPY Left    ACL repair also   PACEMAKER INSERTION  11/15/07   St. Jude Dr. Farrel implanted by Dr Fernande   TEE WITHOUT CARDIOVERSION  11/27/2011   Procedure: TRANSESOPHAGEAL ECHOCARDIOGRAM (TEE);  Surgeon: Vina LULLA Gull, MD;  Location: [facility] ENDOSCOPY;  Service: Cardiovascular;  Laterality: N/A;   TEE WITHOUT CARDIOVERSION  05/28/2012   Procedure: TRANSESOPHAGEAL ECHOCARDIOGRAM (TEE);  Surgeon: Ezra GORMAN Shuck, MD;  Location: [facility] ENDOSCOPY;  Service: Cardiovascular;  Laterality: N/A;  JULIANNA ROGUE    TONSILLECTOMY      Home Medications  Prior to Admission medications  Medication Sig Start Date End Date Taking? Authorizing Provider  Rivaroxaban (XARELTO PO) Take by mouth.   Yes [provider]  AMBULATORY NON FORMULARY MEDICATION CPAP machine qhs    [provider]  ANDROGEL  PUMP 20.25 MG/ACT (1.62%) GEL USE 1 PUMP TO EACH AXILLA DAILY 02/23/14   Juliane Che, PA  colesevelam  (WELCHOL ) 625 MG tablet Take 3 tablets (1,875 mg total) by mouth 2 (two) times daily with a meal. 11/24/13   Juliane Che, PA  ezetimibe  (ZETIA ) 10 MG tablet Take 1 tablet (10 mg total) by mouth daily. 07/01/13   Tish Alm DEL, MD  fenofibrate  160 MG tablet Take 1 tablet  (160 mg total) by mouth daily. 07/01/13   Tish Alm DEL, MD  fluvoxaMINE  (LUVOX ) 100 MG tablet Take 100 mg QAM, take 50 mg QHS 11/24/13   Juliane Che, PA  glucosamine-chondroitin 500-400 MG tablet Take 1 tablet by mouth 2 (two) times daily.      [provider]  losartan  (COZAAR ) 100 MG tablet Take 1 tablet (100 mg total) by mouth daily. 11/17/14   Humberto Elspeth LABOR, MD  mometasone  (NASONEX ) 50 MCG/ACT nasal spray 1-2 puffs each nostril once every night at bedtime 10/09/13 07/08/15  Neysa Reggy BIRCH, MD  Multiple Vitamin (MULITIVITAMIN WITH MINERALS) TABS Take 1 tablet by mouth daily.    [provider]  Omega-3 Fatty Acids (FISH OIL) 1000 MG CAPS Take 1,000 mg by mouth 2 (two) times daily.     [provider]  omeprazole  (PRILOSEC) 40 MG capsule Take 1 capsule (40 mg total) by mouth daily. 11/24/13   Juliane Che, PA  PRADAXA  150 MG CAPS capsule TAKE ONE CAPSULE BY MOUTH TWICE A DAY 02/15/15   Fernande Elspeth BROCKS, MD  rOPINIRole  (REQUIP ) 3 MG tablet Take 1 tablet (3 mg total) by mouth at bedtime. PATIENT NEEDS OFFICE VISIT FOR ADDITIONAL REFILLS 06/23/14   Humberto Elspeth LABOR, MD  sildenafil  (VIAGRA ) 100 MG tablet 1/2 tablet as directed 11/24/13   Juliane Che, PA    Family History Family History  Problem Relation Age of Onset   Sleep apnea Brother    Stroke Mother    Heart attack Father    Heart disease Father    Heart attack Sister    Ulcers Son 5   Heart disease Paternal Grandmother    Colon polyps Brother    Colon cancer Brother    Social History Social History[1] Allergies   Niacin and Statins  Review of Systems Review of Systems  Respiratory:  Positive for cough.    Pertinent findings revealed after performing a 14 point review of systems has been noted in the history of present illness.  Physical Exam Vital Signs BP (!) 176/73 (BP Location: Left Arm)   Pulse 65   Temp 98.4 F (36.9 C) (Oral)   Resp 18   SpO2 95%   No data found.  Physical  Exam Vitals and nursing note reviewed.  Constitutional:      General: He is awake. He is not in acute distress.    Appearance: Normal appearance. He is well-developed and well-groomed. He is not ill-appearing.  HENT:     Head: Normocephalic and atraumatic.     Salivary Glands: Right salivary gland is not diffusely enlarged or tender. Left salivary gland is not diffusely enlarged or tender.     Right Ear: Hearing, tympanic membrane, ear canal and external ear normal.     Left Ear: Hearing, tympanic  membrane, ear canal and external ear normal.     Nose: Nose normal.     Right Turbinates: Not enlarged, swollen or pale.     Left Turbinates: Not enlarged, swollen or pale.     Right Sinus: No maxillary sinus tenderness or frontal sinus tenderness.     Left Sinus: No maxillary sinus tenderness or frontal sinus tenderness.     Mouth/Throat:     Lips: Pink. No lesions.     Mouth: Mucous membranes are moist. No oral lesions.     Tongue: No lesions. Tongue does not deviate from midline.     Palate: No mass and lesions.     Pharynx: Oropharynx is clear. Uvula midline. No pharyngeal swelling, oropharyngeal exudate, posterior oropharyngeal erythema, uvula swelling or postnasal drip.     Tonsils: No tonsillar exudate. 0 on the right. 0 on the left.  Eyes:     General: Lids are normal.        Right eye: No discharge.        Left eye: No discharge.     Conjunctiva/sclera: Conjunctivae normal.     Right eye: Right conjunctiva is not injected.     Left eye: Left conjunctiva is not injected.  Neck:     Trachea: Trachea and phonation normal.  Cardiovascular:     Rate and Rhythm: Normal rate and regular rhythm.  Pulmonary:     Effort: Pulmonary effort is normal.     Breath sounds: Normal breath sounds.  Chest:     Chest wall: No tenderness.  Musculoskeletal:        General: Normal range of motion.     Cervical back: Full passive range of motion without pain, normal range of motion and neck supple.  Normal range of motion.  Lymphadenopathy:     Cervical: No cervical adenopathy.  Skin:    General: Skin is warm and dry.     Findings: No erythema or rash.  Neurological:     General: No focal deficit present.     Mental Status: He is alert and oriented to person, place, and time. Mental status is at baseline.  Psychiatric:        Attention and Perception: Attention and perception normal.        Mood and Affect: Mood and affect normal.        Speech: Speech normal.        Behavior: Behavior normal. Behavior is cooperative.        Thought Content: Thought content normal.     Visual Acuity Right Eye Distance:   Left Eye Distance:   Bilateral Distance:    Right Eye Near:   Left Eye Near:    Bilateral Near:     UC Couse / Diagnostics / Procedures:     Radiology No results found.  Procedures Procedures (including critical care time) EKG  Pending results:  Labs Reviewed  POCT INFLUENZA A/B  POC SOFIA SARS ANTIGEN FIA    Medications Ordered in UC: Medications - No data to display  UC Diagnoses / Final Clinical Impressions(s)   I have reviewed the triage vital signs and the nursing notes.  Pertinent labs & imaging results that were available during my care of the patient were reviewed by me and considered in my medical decision making (see chart for details).    Final diagnoses:  Feeling unwell  Viral URI with cough   Patient advised of negative COVID-19 and influenza test.  Patient has remained afebrile during  the duration of his symptoms.  Patient advised that based on his physical exam findings and the history provided me today do not believe that he is contagious.  Patient encouraged to repeat COVID-19 testing in 3 days if feeling no better or if feeling worse.  Supportive medications discussed.  Conservative care recommended.  Return precautions advised.  Please see discharge instructions below for details of plan of care as provided to patient. ED Prescriptions    None    I have reviewed the PDMP during this encounter.    Discharge Instructions      Your rapid influenza and COVID-19 antigen test today was negative.  No further influenza testing is indicated.  Please consider retesting for COVID-19 in the next 2 to 3 days, particularly if you are not feeling any better.  You are welcome to return here to urgent care to repeat the test or you can take a home COVID-19 test.     If both your COVID-19 tests are negative, then you can safely assume that your illness is due to one of the many less serious illnesses circulating in our community right now.     Conservative care is recommended with rest, drinking plenty of clear fluids, eating only when hungry, taking supportive medications for your symptoms and avoiding being around other people.  Please remain at home until you are fever free for 24 hours without the use of antifever medications such as Tylenol  and ibuprofen.   Please read below to learn more about the medications, dosages and frequencies that I recommend to help alleviate your symptoms and to get you feeling better soon:   Advil, Motrin (ibuprofen): This is a good anti-inflammatory medication which addresses aches, pains and inflammation of the upper airways that causes sinus and nasal congestion as well as in the lower airways which makes your cough feel tight and sometimes burn.  I recommend that you take  400 mg every 6-8 hours as needed.  Please do not take more than 2400 mg of ibuprofen in a 24-hour period and please do not take high doses of ibuprofen for more than 3 days in a row as this can lead to stomach ulcers.   DayQuil Cough DM Plus Congestion liquid:  This is an over-the-counter multisymptom preparation that contains a cough suppressant, dextromethorphan 20 mg, an expectorant which loosens congestion to make coughing easier, guaifenesin  400 mg, and a decongestant intended to decrease mucous production, phenylephrine 10 mg.   Please keep in mind that the FDA recently stated that phenylephrine has been shown to be ineffective.  My personal recommendation is to avoid combination medications such as this one and to instead choose your own combination of the single symptom relievers listed in this summary.     NyQuil Severe Cold and Flu Liquid, NyQuil VapoCOOL Caplets / Tylenol  Cold + Flu + Cough Nighttime Liquid: These are identical over-the-counter multisymptom medications that contain a fever/pain reliever, acetaminophen  650 mg, a cough suppressant, dextromethorphan 30 mg, and an antihistamine commonly found and sleep medications such as Unisom and Sleep-Aid that dries out mucus production and helps you sleep, doxylamine 12.5 mg.  Please do not take this medication along with other cough suppressants with the letters DM or with other antihistamines such as Benadryl , Zyrtec, Xyzal, Allegra or Claritin to avoid overdose.  Please be careful when taking this medication along with other medications containing acetaminophen  or Tylenol .  The maximum dose of Tylenol  in a 24-hour period is 3000 mg, taking more than  3000 mg can damage your liver.          If symptoms have not meaningfully improved in the next 3 to 5 days, please return for repeat evaluation or follow-up with your regular provider.  If symptoms have worsened in the next 3 to 5 days, please go to the emergency room for further evaluation.    Thank you for visiting urgent care today.  We appreciate the opportunity to participate in your care.       Disposition Upon Discharge:  Condition: stable for discharge home  Patient presented with an acute illness with associated systemic symptoms and significant discomfort requiring urgent management. In my opinion, this is a condition that a prudent lay person (someone who possesses an average knowledge of health and medicine) may potentially expect to result in complications if not addressed urgently such as respiratory  distress, impairment of bodily function or dysfunction of bodily organs.   Routine symptom specific, illness specific and/or disease specific instructions were discussed with the patient and/or caregiver at length.   As such, the patient has been evaluated and assessed, work-up was performed and treatment was provided in alignment with urgent care protocols and evidence based medicine.  Patient/parent/caregiver has been advised that the patient may require follow up for further testing and treatment if the symptoms continue in spite of treatment, as clinically indicated and appropriate.  Patient/parent/caregiver has been advised to return to the [facility] or PCP if no better; to PCP or the Emergency Department if new signs and symptoms develop, or if the current signs or symptoms continue to change or worsen for further workup, evaluation and treatment as clinically indicated and appropriate  The patient will follow up with their current PCP if and as advised. If the patient does not currently have a PCP we will assist them in obtaining one.   The patient may need specialty follow up if the symptoms continue, in spite of conservative treatment and management, for further workup, evaluation, consultation and treatment as clinically indicated and appropriate.  Patient/parent/caregiver verbalized understanding and agreement of plan as discussed.  All questions were addressed during visit.  Please see discharge instructions below for further details of plan.  This office note has been dictated using Teaching laboratory technician.  Unfortunately, this method of dictation can sometimes lead to typographical or grammatical errors.  I apologize for your inconvenience in advance if this occurs.  Please do not hesitate to reach out to me if clarification is needed.       [1]  Social History Tobacco Use   Smoking status: Former    Current packs/day: 0.00    Types: Cigarettes    Quit date: 10/30/1977     Years since quitting: 46.7   Smokeless tobacco: Never  Vaping Use   Vaping status: Never Used  Substance Use Topics   Alcohol use: Yes    Alcohol/week: 3.0 standard drinks of alcohol    Types: 3 Cans of beer per week   Drug use: No     Joesph Shaver Gruetli-Laager, PA-C 07/10/24 1843  "

## 2024-07-10 NOTE — Discharge Instructions (Addendum)
 Your rapid influenza and COVID-19 antigen test today was negative.  No further influenza testing is indicated.  Please consider retesting for COVID-19 in the next 2 to 3 days, particularly if you are not feeling any better.  You are welcome to return here to urgent care to repeat the test or you can take a home COVID-19 test.     If both your COVID-19 tests are negative, then you can safely assume that your illness is due to one of the many less serious illnesses circulating in our community right now.     Conservative care is recommended with rest, drinking plenty of clear fluids, eating only when hungry, taking supportive medications for your symptoms and avoiding being around other people.  Please remain at home until you are fever free for 24 hours without the use of antifever medications such as Tylenol  and ibuprofen.   Please read below to learn more about the medications, dosages and frequencies that I recommend to help alleviate your symptoms and to get you feeling better soon:   Advil, Motrin (ibuprofen): This is a good anti-inflammatory medication which addresses aches, pains and inflammation of the upper airways that causes sinus and nasal congestion as well as in the lower airways which makes your cough feel tight and sometimes burn.  I recommend that you take  400 mg every 6-8 hours as needed.  Please do not take more than 2400 mg of ibuprofen in a 24-hour period and please do not take high doses of ibuprofen for more than 3 days in a row as this can lead to stomach ulcers.   DayQuil Cough DM Plus Congestion liquid:  This is an over-the-counter multisymptom preparation that contains a cough suppressant, dextromethorphan 20 mg, an expectorant which loosens congestion to make coughing easier, guaifenesin  400 mg, and a decongestant intended to decrease mucous production, phenylephrine 10 mg.  Please keep in mind that the FDA recently stated that phenylephrine has been shown to be ineffective.  My  personal recommendation is to avoid combination medications such as this one and to instead choose your own combination of the single symptom relievers listed in this summary.     NyQuil Severe Cold and Flu Liquid, NyQuil VapoCOOL Caplets / Tylenol  Cold + Flu + Cough Nighttime Liquid: These are identical over-the-counter multisymptom medications that contain a fever/pain reliever, acetaminophen  650 mg, a cough suppressant, dextromethorphan 30 mg, and an antihistamine commonly found and sleep medications such as Unisom and Sleep-Aid that dries out mucus production and helps you sleep, doxylamine 12.5 mg.  Please do not take this medication along with other cough suppressants with the letters DM or with other antihistamines such as Benadryl , Zyrtec, Xyzal, Allegra or Claritin to avoid overdose.  Please be careful when taking this medication along with other medications containing acetaminophen  or Tylenol .  The maximum dose of Tylenol  in a 24-hour period is 3000 mg, taking more than 3000 mg can damage your liver.          If symptoms have not meaningfully improved in the next 3 to 5 days, please return for repeat evaluation or follow-up with your regular provider.  If symptoms have worsened in the next 3 to 5 days, please go to the emergency room for further evaluation.    Thank you for visiting urgent care today.  We appreciate the opportunity to participate in your care.

## 2024-07-10 NOTE — ED Triage Notes (Signed)
 Cough, runny nose, sneezing started today.  Patient is getting tired.  Family member had similar symptoms 2 weeks ago.  The family member had flu a and pneumonia.  Patient is from [place] .  They are staying with family in the area.    Patient did get the flu shot this fall.

## 2024-07-13 ENCOUNTER — Telehealth (HOSPITAL_COMMUNITY): Payer: Self-pay | Admitting: *Deleted

## 2024-07-13 NOTE — Telephone Encounter (Signed)
 Advised wife her husband would need to be seen to have a rx called in. Wife verbalized understanding.
